# Patient Record
Sex: Male | Born: 1970 | Race: Black or African American | Hispanic: No | Marital: Single | Smoking: Never smoker
Health system: Southern US, Community
[De-identification: ages and names within clinical notes are randomized; demographics above are authoritative.]

## PROBLEM LIST (undated history)

## (undated) DIAGNOSIS — G8220 Paraplegia, unspecified: Secondary | ICD-10-CM

## (undated) DIAGNOSIS — I10 Essential (primary) hypertension: Secondary | ICD-10-CM

## (undated) DIAGNOSIS — Z9359 Other cystostomy status: Secondary | ICD-10-CM

## (undated) DIAGNOSIS — W3400XA Accidental discharge from unspecified firearms or gun, initial encounter: Secondary | ICD-10-CM

## (undated) DIAGNOSIS — Z933 Colostomy status: Secondary | ICD-10-CM

## (undated) DIAGNOSIS — I82409 Acute embolism and thrombosis of unspecified deep veins of unspecified lower extremity: Secondary | ICD-10-CM

## (undated) DIAGNOSIS — N390 Urinary tract infection, site not specified: Secondary | ICD-10-CM

## (undated) HISTORY — PX: INGUINAL HERNIA REPAIR: SUR1180

## (undated) HISTORY — PX: COLON SURGERY: SHX602

---

## 2003-04-24 DIAGNOSIS — W3400XA Accidental discharge from unspecified firearms or gun, initial encounter: Secondary | ICD-10-CM

## 2003-04-24 DIAGNOSIS — G8220 Paraplegia, unspecified: Secondary | ICD-10-CM

## 2003-04-24 HISTORY — DX: Paraplegia, unspecified: G82.20

## 2003-04-24 HISTORY — DX: Accidental discharge from unspecified firearms or gun, initial encounter: W34.00XA

## 2012-10-08 HISTORY — PX: COLOSTOMY: SHX63

## 2016-08-16 ENCOUNTER — Ambulatory Visit: Payer: Self-pay | Admitting: Family Medicine

## 2016-09-23 ENCOUNTER — Observation Stay (HOSPITAL_COMMUNITY)
Admission: EM | Admit: 2016-09-23 | Discharge: 2016-09-23 | Disposition: A | Discharge status: Home / Self Care | Payer: Medicaid Other | Attending: Internal Medicine | Admitting: Internal Medicine

## 2016-09-23 ENCOUNTER — Emergency Department (HOSPITAL_COMMUNITY): Payer: Medicaid Other

## 2016-09-23 ENCOUNTER — Encounter (HOSPITAL_COMMUNITY): Payer: Self-pay | Admitting: Emergency Medicine

## 2016-09-23 DIAGNOSIS — M542 Cervicalgia: Secondary | ICD-10-CM | POA: Diagnosis not present

## 2016-09-23 DIAGNOSIS — I10 Essential (primary) hypertension: Secondary | ICD-10-CM

## 2016-09-23 DIAGNOSIS — N390 Urinary tract infection, site not specified: Principal | ICD-10-CM

## 2016-09-23 DIAGNOSIS — G8220 Paraplegia, unspecified: Secondary | ICD-10-CM | POA: Diagnosis not present

## 2016-09-23 DIAGNOSIS — N179 Acute kidney failure, unspecified: Secondary | ICD-10-CM | POA: Insufficient documentation

## 2016-09-23 DIAGNOSIS — Z933 Colostomy status: Secondary | ICD-10-CM | POA: Diagnosis not present

## 2016-09-23 DIAGNOSIS — Z9359 Other cystostomy status: Secondary | ICD-10-CM

## 2016-09-23 DIAGNOSIS — E876 Hypokalemia: Secondary | ICD-10-CM | POA: Diagnosis not present

## 2016-09-23 DIAGNOSIS — G8929 Other chronic pain: Secondary | ICD-10-CM | POA: Insufficient documentation

## 2016-09-23 DIAGNOSIS — R112 Nausea with vomiting, unspecified: Secondary | ICD-10-CM

## 2016-09-23 HISTORY — DX: Essential (primary) hypertension: I10

## 2016-09-23 HISTORY — DX: Other cystostomy status: Z93.59

## 2016-09-23 HISTORY — DX: Paraplegia, unspecified: G82.20

## 2016-09-23 LAB — URINALYSIS, ROUTINE W REFLEX MICROSCOPIC
BILIRUBIN URINE: NEGATIVE
Glucose, UA: NEGATIVE mg/dL
KETONES UR: NEGATIVE mg/dL
Nitrite: NEGATIVE
PH: 5 (ref 5.0–8.0)
Protein, ur: 30 mg/dL — AB
SPECIFIC GRAVITY, URINE: 1.009 (ref 1.005–1.030)

## 2016-09-23 LAB — I-STAT CG4 LACTIC ACID, ED: Lactic Acid, Venous: 1.83 mmol/L (ref 0.5–1.9)

## 2016-09-23 LAB — CBC WITH DIFFERENTIAL/PLATELET
Basophils Absolute: 0 10*3/uL (ref 0.0–0.1)
Basophils Relative: 0 %
EOS ABS: 0.1 10*3/uL (ref 0.0–0.7)
Eosinophils Relative: 1 %
HCT: 44.9 % (ref 39.0–52.0)
HEMOGLOBIN: 14.6 g/dL (ref 13.0–17.0)
LYMPHS ABS: 2.7 10*3/uL (ref 0.7–4.0)
Lymphocytes Relative: 29 %
MCH: 23.4 pg — AB (ref 26.0–34.0)
MCHC: 32.5 g/dL (ref 30.0–36.0)
MCV: 72.1 fL — AB (ref 78.0–100.0)
MONOS PCT: 8 %
Monocytes Absolute: 0.7 10*3/uL (ref 0.1–1.0)
NEUTROS PCT: 61 %
Neutro Abs: 5.6 10*3/uL (ref 1.7–7.7)
PLATELETS: 263 10*3/uL (ref 150–400)
RBC: 6.23 MIL/uL — AB (ref 4.22–5.81)
RDW: 13.8 % (ref 11.5–15.5)
WBC: 9.1 10*3/uL (ref 4.0–10.5)

## 2016-09-23 LAB — BASIC METABOLIC PANEL
ANION GAP: 11 (ref 5–15)
BUN: 19 mg/dL (ref 6–20)
CHLORIDE: 105 mmol/L (ref 101–111)
CO2: 25 mmol/L (ref 22–32)
CREATININE: 1.22 mg/dL (ref 0.61–1.24)
Calcium: 9.8 mg/dL (ref 8.9–10.3)
GFR calc non Af Amer: >60 mL/min (ref >60)
Glucose, Bld: 134 mg/dL — ABNORMAL HIGH (ref 65–99)
POTASSIUM: 3.4 mmol/L — AB (ref 3.5–5.1)
SODIUM: 141 mmol/L (ref 135–145)

## 2016-09-23 MED ORDER — ENOXAPARIN SODIUM 40 MG/0.4ML ~~LOC~~ SOLN
40.0000 mg | SUBCUTANEOUS | Status: DC
  Filled 2016-09-23: qty 0.4

## 2016-09-23 MED ORDER — CIPROFLOXACIN HCL 500 MG PO TABS: 500.0000 mg | ORAL_TABLET | Freq: Two times a day (BID) | ORAL | 0 refills | Status: DC

## 2016-09-23 MED ORDER — ZOLPIDEM TARTRATE 5 MG PO TABS: 5.0000 mg | ORAL_TABLET | Freq: Every evening | ORAL | Status: DC | PRN

## 2016-09-23 MED ORDER — PIPERACILLIN-TAZOBACTAM 3.375 G IVPB
3.3750 g | Freq: Three times a day (TID) | INTRAVENOUS | Status: DC
  Administered 2016-09-23 (×2): 3.375 g via INTRAVENOUS
  Filled 2016-09-23: qty 50

## 2016-09-23 MED ORDER — ACETAMINOPHEN 325 MG PO TABS
650.0000 mg | ORAL_TABLET | Freq: Once | ORAL | Status: DC
  Filled 2016-09-23: qty 2

## 2016-09-23 MED ORDER — FENTANYL CITRATE (PF) 100 MCG/2ML IJ SOLN
50.0000 ug | Freq: Once | INTRAMUSCULAR | Status: AC
  Administered 2016-09-23: 50 ug via INTRAVENOUS
  Filled 2016-09-23: qty 2

## 2016-09-23 MED ORDER — SODIUM CHLORIDE 0.9 % IV SOLN
INTRAVENOUS | Status: DC
  Administered 2016-09-23: 05:00:00 via INTRAVENOUS

## 2016-09-23 MED ORDER — ACETAMINOPHEN 325 MG PO TABS: 650.0000 mg | ORAL_TABLET | Freq: Four times a day (QID) | ORAL | Status: DC | PRN

## 2016-09-23 MED ORDER — POTASSIUM CHLORIDE 20 MEQ/15ML (10%) PO SOLN
20.0000 meq | Freq: Once | ORAL | Status: AC
  Administered 2016-09-23: 20 meq via ORAL
  Filled 2016-09-23: qty 15

## 2016-09-23 MED ORDER — PIPERACILLIN-TAZOBACTAM 3.375 G IVPB 30 MIN
3.3750 g | Freq: Once | INTRAVENOUS | Status: AC
  Administered 2016-09-23: 3.375 g via INTRAVENOUS
  Filled 2016-09-23: qty 50

## 2016-09-23 MED ORDER — ONDANSETRON HCL 4 MG/2ML IJ SOLN
4.0000 mg | Freq: Once | INTRAMUSCULAR | Status: AC
  Administered 2016-09-23: 4 mg via INTRAVENOUS
  Filled 2016-09-23: qty 2

## 2016-09-23 MED ORDER — ACETAMINOPHEN 650 MG RE SUPP: 650.0000 mg | Freq: Four times a day (QID) | RECTAL | Status: DC | PRN

## 2016-09-23 MED ORDER — HYDRALAZINE HCL 20 MG/ML IJ SOLN: 5.0000 mg | INTRAMUSCULAR | Status: DC | PRN

## 2016-09-23 MED ORDER — ONDANSETRON HCL 4 MG/2ML IJ SOLN: 4.0000 mg | Freq: Three times a day (TID) | INTRAMUSCULAR | Status: DC | PRN
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
## 2016-09-23 NOTE — ED Notes (Signed)
ED Provider at bedside. 

## 2016-09-23 NOTE — ED Notes (Signed)
Bed: WA12 Expected date:  Expected time:  Means of arrival:  Comments: 
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
## 2016-09-23 NOTE — ED Triage Notes (Signed)
Per EMS, pt. From home with complaint recurrent UTI which started last night at 7pm when pt. experienced  Chills and fever at home. Pt. Has chronic foley catheter that had been changed 90 days ago and supposed to have it changed every 30 days. Pt. Is paraplegic for 14 years. Denied SOB. Denied pain.
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
## 2016-09-26 LAB — URINE CULTURE

## 2016-10-02 ENCOUNTER — Telehealth: Payer: Self-pay | Admitting: Internal Medicine

## 2016-10-02 NOTE — Telephone Encounter (Signed)
Patient states that he is feeling just fine and much better after taking his ciprofloxacin even though his urine culture grew ESBL Escherichia coli that was resistant to fluoroquinolone. Told him to seek medical attention if he starts to feel like he is developing a new urinary tract infection for repeat urinalysis and urine culture.

## 2016-10-27 ENCOUNTER — Emergency Department (HOSPITAL_COMMUNITY)
Admission: EM | Admit: 2016-10-27 | Discharge: 2016-10-28 | Disposition: A | Discharge status: Home / Self Care | Payer: Medicaid Other | Attending: Emergency Medicine | Admitting: Emergency Medicine

## 2016-10-27 ENCOUNTER — Encounter (HOSPITAL_COMMUNITY): Payer: Self-pay | Admitting: Nurse Practitioner

## 2016-10-27 DIAGNOSIS — Z960 Presence of urogenital implants: Secondary | ICD-10-CM | POA: Insufficient documentation

## 2016-10-27 DIAGNOSIS — Z5181 Encounter for therapeutic drug level monitoring: Secondary | ICD-10-CM | POA: Diagnosis not present

## 2016-10-27 DIAGNOSIS — I10 Essential (primary) hypertension: Secondary | ICD-10-CM | POA: Insufficient documentation

## 2016-10-27 DIAGNOSIS — R509 Fever, unspecified: Secondary | ICD-10-CM | POA: Diagnosis present

## 2016-10-27 DIAGNOSIS — N390 Urinary tract infection, site not specified: Secondary | ICD-10-CM | POA: Insufficient documentation

## 2016-10-27 DIAGNOSIS — Z9359 Other cystostomy status: Secondary | ICD-10-CM

## 2016-10-27 MED ORDER — GENTAMICIN SULFATE 40 MG/ML IJ SOLN
5.0000 mg/kg | INTRAVENOUS | Status: DC
  Administered 2016-10-28: 520 mg via INTRAVENOUS
  Filled 2016-10-27: qty 13

## 2016-10-27 NOTE — ED Provider Notes (Signed)
[facility]-EMERGENCY DEPT Provider Note   CSN: [id number] Arrival date & time: 10/27/16  2319   By signing my name below, I, Cynda Acres, attest that this documentation has been prepared under the direction and in the presence of Dione Booze, MD. Electronically Signed: Cynda Acres, Scribe. 10/27/16. 11:56 PM.   History   Chief Complaint No chief complaint on file.   HPI Comments: Charles Poole is a 46 y.o. male with a hx of paraplegic spinal paralysis and HTN, who presents to the Emergency Department complaining of a possible UTI, symptoms began last night. Patient states he is having symptoms consistent with a urinary tract infection. Patient states he has had a suprapubic cathter for four years now, in which he has had recurrent UTIs. Patient has associated diaphoresis, chills, vomiting, and a subjective fever. No modifying factors indicated. He denies any nausea.   The history is provided by the patient. No language interpreter was used.    Past Medical History:  Diagnosis Date  . Hypertension   . Paraplegic spinal paralysis (HCC)   . Suprapubic catheter [facility])     Patient Active Problem List   Diagnosis Date Noted  . Complicated UTI (urinary tract infection) 09/23/2016  . Hypokalemia 09/23/2016  . Paraplegic spinal paralysis (HCC)   . Suprapubic catheter (HCC)   . Essential hypertension   . Intractable vomiting with nausea     Past Surgical History:  Procedure Laterality Date  . COLOSTOMY    . SUPRAPUBIC CATHETER INSERTION         Home Medications    Prior to Admission medications   Medication Sig Start Date End Date Taking? Authorizing Provider  ciprofloxacin (CIPRO) 500 MG tablet Take 1 tablet (500 mg total) by mouth 2 (two) times daily. 09/23/16   Renae Fickle, MD    Family History Family History  Problem Relation Age of Onset  . Heart attack Father     Social History Social History  Substance Use Topics  . Smoking status: Never Smoker  .  Smokeless tobacco: Never Used  . Alcohol use 0.6 - 1.2 oz/week    1 - 2 Cans of beer per week     Allergies   Patient has no known allergies.   Review of Systems Review of Systems  Constitutional: Positive for chills, diaphoresis and fever.  Gastrointestinal: Positive for vomiting. Negative for nausea.  All other systems reviewed and are negative.    Physical Exam Updated Vital Signs BP (!) 124/111 (BP Location: Right Wrist)   Pulse 107   Temp 98.3 F (36.8 C) (Oral)   Resp 18   Wt (!) 304 lb 3.8 oz (138 kg)   SpO2 100%   BMI 40.14 kg/m   Physical Exam  Constitutional: He is oriented to person, place, and time. He appears well-developed and well-nourished.  HENT:  Head: Normocephalic and atraumatic.  Eyes: EOM are normal. Pupils are equal, round, and reactive to light.  Neck: Normal range of motion. Neck supple. No JVD present.  Cardiovascular: Normal rate, regular rhythm and normal heart sounds.   No murmur heard. Pulmonary/Chest: Effort normal and breath sounds normal. He has no wheezes. He has no rales. He exhibits no tenderness.  Abdominal: Soft. Bowel sounds are normal. He exhibits no distension and no mass. There is no tenderness.  Suprpubic catheter in place    Musculoskeletal: Normal range of motion. He exhibits no edema.  Lymphadenopathy:    He has no cervical adenopathy.  Neurological: He is alert and oriented  to person, place, and time. No cranial nerve deficit. He exhibits normal muscle tone. Coordination normal.  Paraplegic.   Skin: Skin is warm and dry. No rash noted.  Diaphoretic   Psychiatric: He has a normal mood and affect. His behavior is normal. Judgment and thought content normal.  Nursing note and vitals reviewed.    ED Treatments / Results  DIAGNOSTIC STUDIES: Oxygen Saturation is 100% on RA, normal by my interpretation.    COORDINATION OF CARE: 11:48 PM Discussed treatment plan with pt at bedside and pt agreed to plan.  Labs (all  labs ordered are listed, but only abnormal results are displayed) Labs Reviewed  BASIC METABOLIC PANEL - Abnormal; Notable for the following:       Result Value   Glucose, Bld 135 (*)    BUN 23 (*)    All other components within normal limits  CBC WITH DIFFERENTIAL/PLATELET - Abnormal; Notable for the following:    RBC 6.55 (*)    MCV 72.4 (*)    MCH 23.1 (*)    All other components within normal limits  RAPID URINE DRUG SCREEN, HOSP PERFORMED - Abnormal; Notable for the following:    Tetrahydrocannabinol POSITIVE (*)    All other components within normal limits  URINALYSIS, ROUTINE W REFLEX MICROSCOPIC - Abnormal; Notable for the following:    APPearance TURBID (*)    Hgb urine dipstick MODERATE (*)    Protein, ur 100 (*)    Nitrite POSITIVE (*)    Leukocytes, UA LARGE (*)    Bacteria, UA MANY (*)    Non Squamous Epithelial 0-5 (*)    All other components within normal limits  I-STAT CG4 LACTIC ACID, ED - Abnormal; Notable for the following:    Lactic Acid, Venous 1.91 (*)    All other components within normal limits  URINE CULTURE     Procedures Procedures (including critical care time)  Medications Ordered in ED Medications  gentamicin (GARAMYCIN) 520 mg in dextrose 5 % 100 mL IVPB (0 mg Intravenous Stopped 10/28/16 0127)     Initial Impression / Assessment and Plan / ED Course  I have reviewed the triage vital signs and the nursing notes.  Pertinent lab results that were available during my care of the patient were reviewed by me and considered in my medical decision making (see chart for details).  Probable urinary tract infection in patient with chronic suprapubic catheter. Old records are reviewed, and he had been admitted with urinary tract infection and discharged on ciprofloxacin. However, culture shows that he had Escherichia coli resistant to ciprofloxacin. Only medications it was sensitive to 4 gentamicin, imipenem, and nitrofurantoin. Presumably, he has the  same infecting organism so he is given a dose of gentamicin in the ED. He is nontoxic in appearance and has normal WBC and is afebrile, so I feel that treatment can be initiated as an outpatient. His suprapubic catheter was exchanged, and he is discharged with prescription for nitrofurantoin. Urine culture was obtained. He is referred to urology for follow-up.  Final Clinical Impressions(s) / ED Diagnoses   Final diagnoses:  Urinary tract infection without hematuria, site unspecified  Suprapubic catheter (HCC)    New Prescriptions New Prescriptions   NITROFURANTOIN, MACROCRYSTAL-MONOHYDRATE, (MACROBID) 100 MG CAPSULE    Take 1 capsule (100 mg total) by mouth 2 (two) times daily.   I personally performed the services described in this documentation, which was scribed in my presence. The recorded information has been reviewed and is accurate.  Dione Boozeavid Emre Stock, MD 10/28/16 [phone]

## 2016-10-27 NOTE — ED Triage Notes (Signed)
Pt states he probably has a UTI. He is c/o fever and chills and has obvious diaphoresis. Has a suprapubic catheter and states he has recurrent UTIs and the symptoms he is experiencing are typical of a UTI.

## 2016-10-27 NOTE — ED Notes (Signed)
Bed: WA11 Expected date:  Expected time:  Means of arrival:  Comments: Hold for Hall B 

## 2016-10-28 LAB — BASIC METABOLIC PANEL
Anion gap: 11 (ref 5–15)
BUN: 23 mg/dL — AB (ref 6–20)
CALCIUM: 10 mg/dL (ref 8.9–10.3)
CHLORIDE: 105 mmol/L (ref 101–111)
CO2: 26 mmol/L (ref 22–32)
CREATININE: 1.08 mg/dL (ref 0.61–1.24)
Glucose, Bld: 135 mg/dL — ABNORMAL HIGH (ref 65–99)
Potassium: 3.8 mmol/L (ref 3.5–5.1)
SODIUM: 142 mmol/L (ref 135–145)

## 2016-10-28 LAB — I-STAT CG4 LACTIC ACID, ED: LACTIC ACID, VENOUS: 1.91 mmol/L — AB (ref 0.5–1.9)

## 2016-10-28 LAB — URINALYSIS, ROUTINE W REFLEX MICROSCOPIC
Bilirubin Urine: NEGATIVE
GLUCOSE, UA: NEGATIVE mg/dL
Ketones, ur: NEGATIVE mg/dL
Nitrite: POSITIVE — AB
PH: 6 (ref 5.0–8.0)
Protein, ur: 100 mg/dL — AB
Specific Gravity, Urine: 1.011 (ref 1.005–1.030)
Squamous Epithelial / LPF: NONE SEEN

## 2016-10-28 LAB — CBC WITH DIFFERENTIAL/PLATELET
BASOS PCT: 0 %
Basophils Absolute: 0 10*3/uL (ref 0.0–0.1)
EOS ABS: 0.1 10*3/uL (ref 0.0–0.7)
EOS PCT: 1 %
HCT: 47.4 % (ref 39.0–52.0)
Hemoglobin: 15.1 g/dL (ref 13.0–17.0)
LYMPHS ABS: 2.1 10*3/uL (ref 0.7–4.0)
Lymphocytes Relative: 24 %
MCH: 23.1 pg — ABNORMAL LOW (ref 26.0–34.0)
MCHC: 31.9 g/dL (ref 30.0–36.0)
MCV: 72.4 fL — ABNORMAL LOW (ref 78.0–100.0)
Monocytes Absolute: 0.6 10*3/uL (ref 0.1–1.0)
Monocytes Relative: 7 %
NEUTROS PCT: 68 %
Neutro Abs: 5.9 10*3/uL (ref 1.7–7.7)
PLATELETS: 204 10*3/uL (ref 150–400)
RBC: 6.55 MIL/uL — AB (ref 4.22–5.81)
RDW: 14.5 % (ref 11.5–15.5)
WBC: 8.7 10*3/uL (ref 4.0–10.5)

## 2016-10-28 LAB — RAPID URINE DRUG SCREEN, HOSP PERFORMED
Amphetamines: NOT DETECTED
BENZODIAZEPINES: NOT DETECTED
Barbiturates: NOT DETECTED
Cocaine: NOT DETECTED
OPIATES: NOT DETECTED
Tetrahydrocannabinol: POSITIVE — AB

## 2016-10-28 MED ORDER — NITROFURANTOIN MONOHYD MACRO 100 MG PO CAPS: 100.0000 mg | ORAL_CAPSULE | Freq: Two times a day (BID) | ORAL | 0 refills | Status: DC

## 2016-10-28 NOTE — Discharge Instructions (Signed)
He will always be prone to urinary infections because of the suprapubic catheter. Please contact the urologist to get established in their practice. Urine has been sent for culture. If culture shows she need to be on a different antibiotic, we will contact you. Return to the emergency department if you start running a fever or start feeling worse.

## 2016-10-28 NOTE — ED Notes (Signed)
Patient denies pain and is resting comfortably.  

## 2016-10-28 NOTE — ED Notes (Signed)
D/C information reviewed with patient. Patient is upset that he is being discharged and not admitted for his UTI. Explained to him that per results he was given an initial dose of antibiotics and is also being sent home with a prescription. He states everytime this happens that the doctor always calls and says his culture is growing something different and has to change medicines and that is why he should be admitted for 3-4 days until culture comes back. Explained that it is a normal process to go home on one medication and due to him having a suprapubic and other medical conditions that makes him highly susceptible to UTIs that his culture may show some resistance and assured him he would be readily contacted and a new prescription would be given however, the condition did not warrant a hospital stay pending those results. Pt verbalized understanding and is patiently waiting for [facility] transportation.

## 2016-10-31 LAB — URINE CULTURE

## 2016-11-01 ENCOUNTER — Telehealth (HOSPITAL_BASED_OUTPATIENT_CLINIC_OR_DEPARTMENT_OTHER): Payer: Self-pay

## 2016-11-01 NOTE — Progress Notes (Signed)
ED Antimicrobial Stewardship Positive Culture Follow Up   Charles GarfinkelKenneth Poole is an 46 y.o. male who [facility] with a chief complaint of No chief complaint on file.   Recent Results (from the past 720 hour(s))  Urine culture     Status: Abnormal   Collection Time: 10/28/16  1:44 AM  Result Value Ref Range Status   Specimen Description URINE, CLEAN CATCH  Final   Special Requests NONE  Final   Culture (A)  Final    >=100,000 COLONIES/mL SERRATIA MARCESCENS >=100,000 COLONIES/mL ESCHERICHIA COLI Confirmed Extended Spectrum Beta-Lactamase Producer (ESBL) Performed at [facility], [address] [place], [place] [address]    Report Status 10/31/2016 FINAL  Final   Organism ID, Bacteria SERRATIA MARCESCENS (A)  Final   Organism ID, Bacteria ESCHERICHIA COLI (A)  Final      Susceptibility   Escherichia coli - MIC*    AMPICILLIN >=32 RESISTANT Resistant     CEFAZOLIN >=64 RESISTANT Resistant     CEFTRIAXONE >=64 RESISTANT Resistant     CIPROFLOXACIN >=4 RESISTANT Resistant     GENTAMICIN <=1 SENSITIVE Sensitive     IMIPENEM <=0.25 SENSITIVE Sensitive     NITROFURANTOIN <=16 SENSITIVE Sensitive     TRIMETH/SULFA >=320 RESISTANT Resistant     AMPICILLIN/SULBACTAM 16 INTERMEDIATE Intermediate     PIP/TAZO <=4 SENSITIVE Sensitive     Extended ESBL POSITIVE Resistant     * >=100,000 COLONIES/mL ESCHERICHIA COLI   Serratia marcescens - MIC*    CEFAZOLIN >=64 RESISTANT Resistant     CEFTRIAXONE >=64 RESISTANT Resistant     CIPROFLOXACIN 2 INTERMEDIATE Intermediate     GENTAMICIN <=1 SENSITIVE Sensitive     NITROFURANTOIN 128 RESISTANT Resistant     TRIMETH/SULFA 40 SENSITIVE Sensitive     * >=100,000 COLONIES/mL SERRATIA MARCESCENS    [x]  Treated with Nitrofurantoin, organism resistant to prescribed antimicrobial []  Patient discharged originally without antimicrobial agent and treatment is now indicated  New antibiotic prescription:  CONTINUE:  nitrofurantoin START: Bactrim DS 1 tab PO BID x 5 days  ED Provider: Fayrene HelperBowie Tran, PA-C   Mahala MenghiniMargaret E Shuda 11/01/2016, 9:18 AM Infectious Diseases Pharmacist Phone# [phone]

## 2016-11-01 NOTE — Telephone Encounter (Signed)
Post ED Visit - Positive Culture Follow-up: Successful Patient Follow-Up  Culture assessed and recommendations reviewed by: []  Enzo BiNathan Batchelder, Pharm.D. []  Celedonio MiyamotoJeremy Frens, [address] BCPS []  Garvin FilaMike Maccia, Pharm.D. []  Georgina PillionElizabeth Martin, Pharm.D., BCPS []  Mahanoy CityMinh Pham, [address]D., BCPS, AAHIVP []  Estella HuskMichelle Turner, Pharm.D., BCPS, AAHIVP []  Tennis Mustassie Stewart, Pharm.D. []  Sherle Poeob Vincent, [address]Ophelia Shoulder. X  Margaret Shuda, Pharm.D.  Positive urine culture  []  Patient discharged without antimicrobial prescription and treatment is now indicated [x]  Organism is resistant to prescribed ED discharge antimicrobial sent out on Nitrofurantoin, needs addl abx []  Patient with positive blood cultures  Changes discussed with ED provider: Fayrene HelperBowie Tran PA New antibiotic prescription "Continue Nitrofurantoin Start Bactrim DS  1 tab po BID x 5 days" [phone] and left on VM.    Contacted patient, date 11/01/16, time 1533  Pt informed.   Arvid RightClark, Giacomo Valone Dorn 11/01/2016, 4:03 PM

## 2016-11-20 ENCOUNTER — Inpatient Hospital Stay (HOSPITAL_COMMUNITY)
Admission: EM | Admit: 2016-11-20 | Discharge: 2016-11-22 | DRG: 690 | Disposition: A | Discharge status: Home / Self Care | Payer: Medicaid Other | Attending: Internal Medicine | Admitting: Internal Medicine

## 2016-11-20 ENCOUNTER — Encounter (HOSPITAL_COMMUNITY): Payer: Self-pay | Admitting: Emergency Medicine

## 2016-11-20 DIAGNOSIS — W3400XS Accidental discharge from unspecified firearms or gun, sequela: Secondary | ICD-10-CM

## 2016-11-20 DIAGNOSIS — R509 Fever, unspecified: Secondary | ICD-10-CM

## 2016-11-20 DIAGNOSIS — D62 Acute posthemorrhagic anemia: Secondary | ICD-10-CM | POA: Diagnosis present

## 2016-11-20 DIAGNOSIS — A419 Sepsis, unspecified organism: Secondary | ICD-10-CM | POA: Diagnosis present

## 2016-11-20 DIAGNOSIS — K920 Hematemesis: Secondary | ICD-10-CM | POA: Diagnosis present

## 2016-11-20 DIAGNOSIS — Z8249 Family history of ischemic heart disease and other diseases of the circulatory system: Secondary | ICD-10-CM

## 2016-11-20 DIAGNOSIS — Z87440 Personal history of urinary (tract) infections: Secondary | ICD-10-CM

## 2016-11-20 DIAGNOSIS — N390 Urinary tract infection, site not specified: Secondary | ICD-10-CM | POA: Diagnosis present

## 2016-11-20 DIAGNOSIS — Z933 Colostomy status: Secondary | ICD-10-CM

## 2016-11-20 DIAGNOSIS — G8220 Paraplegia, unspecified: Secondary | ICD-10-CM | POA: Diagnosis present

## 2016-11-20 DIAGNOSIS — I10 Essential (primary) hypertension: Secondary | ICD-10-CM | POA: Diagnosis present

## 2016-11-20 HISTORY — DX: Colostomy status: Z93.3

## 2016-11-20 HISTORY — DX: Urinary tract infection, site not specified: N39.0

## 2016-11-20 HISTORY — DX: Accidental discharge from unspecified firearms or gun, initial encounter: W34.00XA

## 2016-11-20 LAB — COMPREHENSIVE METABOLIC PANEL
ALBUMIN: 3.8 g/dL (ref 3.5–5.0)
ALK PHOS: 65 U/L (ref 38–126)
ALT: 26 U/L (ref 17–63)
AST: 26 U/L (ref 15–41)
Anion gap: 10 (ref 5–15)
BUN: 25 mg/dL — ABNORMAL HIGH (ref 6–20)
CALCIUM: 9.7 mg/dL (ref 8.9–10.3)
CHLORIDE: 105 mmol/L (ref 101–111)
CO2: 24 mmol/L (ref 22–32)
CREATININE: 1.69 mg/dL — AB (ref 0.61–1.24)
GFR calc Af Amer: 55 mL/min — ABNORMAL LOW (ref >60)
GFR calc non Af Amer: 48 mL/min — ABNORMAL LOW (ref >60)
Glucose, Bld: 137 mg/dL — ABNORMAL HIGH (ref 65–99)
Potassium: 4.6 mmol/L (ref 3.5–5.1)
SODIUM: 139 mmol/L (ref 135–145)
Total Bilirubin: 0.5 mg/dL (ref 0.3–1.2)
Total Protein: 7 g/dL (ref 6.5–8.1)

## 2016-11-20 LAB — I-STAT CG4 LACTIC ACID, ED: Lactic Acid, Venous: 2.51 mmol/L (ref 0.5–1.9)

## 2016-11-20 MED ORDER — SODIUM CHLORIDE 0.9 % IV BOLUS (SEPSIS): 1000.0000 mL | Freq: Once | INTRAVENOUS | Status: DC

## 2016-11-20 MED ORDER — STERILE WATER FOR INJECTION IJ SOLN
INTRAMUSCULAR | Status: AC
  Filled 2016-11-20: qty 10

## 2016-11-20 MED ORDER — SODIUM CHLORIDE 0.9 % IV SOLN
1.0000 g | Freq: Once | INTRAVENOUS | Status: AC
  Administered 2016-11-21: 1 g via INTRAVENOUS
  Filled 2016-11-20: qty 1

## 2016-11-20 MED ORDER — MEROPENEM 1 G IV SOLR
1.0000 g | Freq: Three times a day (TID) | INTRAVENOUS | Status: DC
  Filled 2016-11-20 (×2): qty 1

## 2016-11-20 MED ORDER — PANTOPRAZOLE SODIUM 40 MG IV SOLR
40.0000 mg | Freq: Once | INTRAVENOUS | Status: AC
  Administered 2016-11-21: 40 mg via INTRAVENOUS
  Filled 2016-11-20: qty 40

## 2016-11-20 MED ORDER — SODIUM CHLORIDE 0.9 % IV BOLUS (SEPSIS)
1000.0000 mL | Freq: Once | INTRAVENOUS | Status: AC
  Administered 2016-11-20: 1000 mL via INTRAVENOUS

## 2016-11-20 MED ORDER — SODIUM CHLORIDE 0.9 % IV BOLUS (SEPSIS): 500.0000 mL | Freq: Once | INTRAVENOUS | Status: DC
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
## 2016-11-20 NOTE — ED Provider Notes (Signed)
Patient with shaking chills and diaphoresis for the past 3 days. He vomited 3 times today. He has had similar symptoms in the past when he's had a urinary tract infection. On exam patient is diaphoretic, tremulous. Alert and appropriate Glasgow Coma Score 15 lungs clear auscultation heart regular rate and rhythm abdomen nondistended nontender extremities without edema skin without rash no decubitus ulcer. Genitalia normal male. He has suprapubic catheter in place. Patient markedly more anemic over 3 weeks ago has also had increase in creatinine. Code sepsis called   Doug SouSam Kiran Lapine, MD 11/20/16 [phone]

## 2016-11-21 ENCOUNTER — Encounter (HOSPITAL_COMMUNITY): Payer: Self-pay | Admitting: Internal Medicine

## 2016-11-21 ENCOUNTER — Emergency Department (HOSPITAL_COMMUNITY): Payer: Medicaid Other

## 2016-11-21 DIAGNOSIS — A419 Sepsis, unspecified organism: Secondary | ICD-10-CM | POA: Diagnosis not present

## 2016-11-21 DIAGNOSIS — Z8249 Family history of ischemic heart disease and other diseases of the circulatory system: Secondary | ICD-10-CM | POA: Diagnosis not present

## 2016-11-21 DIAGNOSIS — K920 Hematemesis: Secondary | ICD-10-CM | POA: Diagnosis present

## 2016-11-21 DIAGNOSIS — G8220 Paraplegia, unspecified: Secondary | ICD-10-CM | POA: Diagnosis present

## 2016-11-21 DIAGNOSIS — D62 Acute posthemorrhagic anemia: Secondary | ICD-10-CM | POA: Diagnosis not present

## 2016-11-21 DIAGNOSIS — N390 Urinary tract infection, site not specified: Secondary | ICD-10-CM | POA: Diagnosis present

## 2016-11-21 DIAGNOSIS — Z87440 Personal history of urinary (tract) infections: Secondary | ICD-10-CM | POA: Diagnosis not present

## 2016-11-21 DIAGNOSIS — Z933 Colostomy status: Secondary | ICD-10-CM | POA: Diagnosis not present

## 2016-11-21 DIAGNOSIS — R509 Fever, unspecified: Secondary | ICD-10-CM | POA: Diagnosis present

## 2016-11-21 DIAGNOSIS — W3400XS Accidental discharge from unspecified firearms or gun, sequela: Secondary | ICD-10-CM | POA: Diagnosis not present

## 2016-11-21 DIAGNOSIS — I10 Essential (primary) hypertension: Secondary | ICD-10-CM | POA: Diagnosis present

## 2016-11-21 LAB — COMPREHENSIVE METABOLIC PANEL
ALBUMIN: 3.3 g/dL — AB (ref 3.5–5.0)
ALT: 21 U/L (ref 17–63)
ANION GAP: 8 (ref 5–15)
AST: 19 U/L (ref 15–41)
Alkaline Phosphatase: 58 U/L (ref 38–126)
BILIRUBIN TOTAL: 0.3 mg/dL (ref 0.3–1.2)
BUN: 31 mg/dL — AB (ref 6–20)
CHLORIDE: 108 mmol/L (ref 101–111)
CO2: 24 mmol/L (ref 22–32)
Calcium: 9.2 mg/dL (ref 8.9–10.3)
Creatinine, Ser: 1.83 mg/dL — ABNORMAL HIGH (ref 0.61–1.24)
GFR calc Af Amer: 50 mL/min — ABNORMAL LOW (ref >60)
GFR calc non Af Amer: 43 mL/min — ABNORMAL LOW (ref >60)
GLUCOSE: 134 mg/dL — AB (ref 65–99)
POTASSIUM: 4.2 mmol/L (ref 3.5–5.1)
SODIUM: 140 mmol/L (ref 135–145)
Total Protein: 6.4 g/dL — ABNORMAL LOW (ref 6.5–8.1)

## 2016-11-21 LAB — CBC
HCT: 39.7 % (ref 39.0–52.0)
HEMATOCRIT: 41.8 % (ref 39.0–52.0)
HEMATOCRIT: 40.1 % (ref 39.0–52.0)
Hemoglobin: 13.3 g/dL (ref 13.0–17.0)
Hemoglobin: 12.2 g/dL — ABNORMAL LOW (ref 13.0–17.0)
Hemoglobin: 12.4 g/dL — ABNORMAL LOW (ref 13.0–17.0)
MCH: 23.1 pg — AB (ref 26.0–34.0)
MCH: 22.6 pg — AB (ref 26.0–34.0)
MCH: 22.7 pg — ABNORMAL LOW (ref 26.0–34.0)
MCHC: 31.8 g/dL (ref 30.0–36.0)
MCHC: 30.7 g/dL (ref 30.0–36.0)
MCHC: 30.9 g/dL (ref 30.0–36.0)
MCV: 72.7 fL — ABNORMAL LOW (ref 78.0–100.0)
MCV: 73.4 fL — ABNORMAL LOW (ref 78.0–100.0)
MCV: 73.3 fL — ABNORMAL LOW (ref 78.0–100.0)
PLATELETS: 149 10*3/uL — AB (ref 150–400)
PLATELETS: 148 10*3/uL — AB (ref 150–400)
Platelets: 170 10*3/uL (ref 150–400)
RBC: 5.75 MIL/uL (ref 4.22–5.81)
RBC: 5.41 MIL/uL (ref 4.22–5.81)
RBC: 5.47 MIL/uL (ref 4.22–5.81)
RDW: 15.5 % (ref 11.5–15.5)
RDW: 15.5 % (ref 11.5–15.5)
RDW: 15.7 % — AB (ref 11.5–15.5)
WBC: 6.5 10*3/uL (ref 4.0–10.5)
WBC: 6.6 10*3/uL (ref 4.0–10.5)
WBC: 6.2 10*3/uL (ref 4.0–10.5)

## 2016-11-21 LAB — CBC WITH DIFFERENTIAL/PLATELET
BASOS PCT: 0 %
Basophils Absolute: 0 10*3/uL (ref 0.0–0.1)
EOS ABS: 0.2 10*3/uL (ref 0.0–0.7)
EOS PCT: 3 %
HCT: 41.3 % (ref 39.0–52.0)
Hemoglobin: 9.4 g/dL — ABNORMAL LOW (ref 13.0–17.0)
Lymphocytes Relative: 33 %
Lymphs Abs: 2 10*3/uL (ref 0.7–4.0)
MCH: 16.5 pg — AB (ref 26.0–34.0)
MCHC: 22.8 g/dL — ABNORMAL LOW (ref 30.0–36.0)
MCV: 72.6 fL — AB (ref 78.0–100.0)
MONO ABS: 0.4 10*3/uL (ref 0.1–1.0)
Monocytes Relative: 7 %
NEUTROS ABS: 3.5 10*3/uL (ref 1.7–7.7)
NEUTROS PCT: 57 %
PLATELETS: 135 10*3/uL — AB (ref 150–400)
RBC: 5.69 MIL/uL (ref 4.22–5.81)
RDW: 15.1 % (ref 11.5–15.5)
WBC: 6.1 10*3/uL (ref 4.0–10.5)

## 2016-11-21 LAB — TYPE AND SCREEN
ABO/RH(D): AB POS
Antibody Screen: NEGATIVE

## 2016-11-21 LAB — LACTIC ACID, PLASMA
Lactic Acid, Venous: 1.8 mmol/L (ref 0.5–1.9)
Lactic Acid, Venous: 2.1 mmol/L (ref 0.5–1.9)

## 2016-11-21 LAB — ABO/RH: ABO/RH(D): AB POS

## 2016-11-21 LAB — PROCALCITONIN: PROCALCITONIN: 0.1 ng/mL

## 2016-11-21 LAB — URINALYSIS, ROUTINE W REFLEX MICROSCOPIC
BILIRUBIN URINE: NEGATIVE
Glucose, UA: 50 mg/dL — AB
KETONES UR: NEGATIVE mg/dL
NITRITE: NEGATIVE
PROTEIN: 30 mg/dL — AB
SPECIFIC GRAVITY, URINE: 1.011 (ref 1.005–1.030)
pH: 7 (ref 5.0–8.0)

## 2016-11-21 LAB — GLUCOSE, CAPILLARY: Glucose-Capillary: 126 mg/dL — ABNORMAL HIGH (ref 65–99)

## 2016-11-21 LAB — PROTIME-INR
INR: 1.01
PROTHROMBIN TIME: 13.3 s (ref 11.4–15.2)

## 2016-11-21 LAB — APTT: APTT: 30 s (ref 24–36)

## 2016-11-21 LAB — I-STAT CG4 LACTIC ACID, ED: LACTIC ACID, VENOUS: 1.93 mmol/L — AB (ref 0.5–1.9)

## 2016-11-21 MED ORDER — SODIUM CHLORIDE 0.9 % IV SOLN
80.0000 mg | Freq: Once | INTRAVENOUS | Status: AC
  Administered 2016-11-21: 80 mg via INTRAVENOUS
  Filled 2016-11-21: qty 80

## 2016-11-21 MED ORDER — PANTOPRAZOLE SODIUM 40 MG IV SOLR: 40.0000 mg | Freq: Two times a day (BID) | INTRAVENOUS | Status: DC

## 2016-11-21 MED ORDER — ACETAMINOPHEN 650 MG RE SUPP: 650.0000 mg | Freq: Four times a day (QID) | RECTAL | Status: DC | PRN

## 2016-11-21 MED ORDER — SODIUM CHLORIDE 0.9 % IV SOLN
8.0000 mg/h | INTRAVENOUS | Status: DC
  Administered 2016-11-21: 8 mg/h via INTRAVENOUS
  Filled 2016-11-21 (×2): qty 80

## 2016-11-21 MED ORDER — ONDANSETRON HCL 4 MG/2ML IJ SOLN: 4.0000 mg | Freq: Four times a day (QID) | INTRAMUSCULAR | Status: DC | PRN

## 2016-11-21 MED ORDER — SODIUM CHLORIDE 0.9 % IV BOLUS (SEPSIS)
1000.0000 mL | Freq: Once | INTRAVENOUS | Status: AC
  Administered 2016-11-21: 1000 mL via INTRAVENOUS

## 2016-11-21 MED ORDER — SODIUM CHLORIDE 0.9 % IV SOLN
1.0000 g | Freq: Three times a day (TID) | INTRAVENOUS | Status: DC
  Administered 2016-11-21 – 2016-11-22 (×5): 1 g via INTRAVENOUS
  Filled 2016-11-21 (×6): qty 1

## 2016-11-21 MED ORDER — SODIUM CHLORIDE 0.9 % IV SOLN
INTRAVENOUS | Status: AC
  Administered 2016-11-21 (×2): via INTRAVENOUS

## 2016-11-21 MED ORDER — ACETAMINOPHEN 325 MG PO TABS: 650.0000 mg | ORAL_TABLET | Freq: Four times a day (QID) | ORAL | Status: DC | PRN

## 2016-11-21 MED ORDER — PANTOPRAZOLE SODIUM 40 MG PO TBEC
40.0000 mg | DELAYED_RELEASE_TABLET | Freq: Two times a day (BID) | ORAL | Status: DC
  Administered 2016-11-21 – 2016-11-22 (×3): 40 mg via ORAL
  Filled 2016-11-21 (×3): qty 1

## 2016-11-21 MED ORDER — ONDANSETRON HCL 4 MG PO TABS: 4.0000 mg | ORAL_TABLET | Freq: Four times a day (QID) | ORAL | Status: DC | PRN

## 2016-11-21 NOTE — Progress Notes (Signed)
Pharmacy Antibiotic Note  Charles GarfinkelKenneth Poole is a 46 y.o. male admitted on 11/20/2016 with UTI.  Pharmacy has been consulted for Merrem dosing. Hx of serratia and ESBL E Coli. WBC WNL. Acute renal failure.   Plan: -Merrem 1g IV q8h -Trend WBC, temp, renal function -F/U urine culture for directed therapy   Temp (24hrs), Avg:98 F (36.7 C), Min:97.7 F (36.5 C), Max:98.3 F (36.8 C)    Recent Labs Lab 11/20/16 2306 11/20/16 2311 11/21/16 0223  WBC 6.1  --   --   CREATININE 1.69*  --   --   LATICACIDVEN  --  2.51* 1.93*    CrCl cannot be calculated (Unknown ideal weight.).    No Known Allergies  Charles Poole, Charles Poole 11/21/2016 3:00 AM

## 2016-11-21 NOTE — Progress Notes (Signed)
Critical lactic acid 2.1 from lab this morning. MD paged with results.

## 2016-11-21 NOTE — Care Management Note (Signed)
Case Management Note  Patient Details  Name: Marshell GarfinkelKenneth Esquibel MRN: [id number] Date of Birth: 13-Jan-1971  Subjective/Objective:    Patient presents with sepsis, which was ruled out, has uti, conts on abx, gib, but with no evidence of active bleeding with hgb of 9.6 which was an error, conts on oral protonix, ivf's, and iv abx. Transferring to floor,  NCM will cont to follow for dc needs.               Action/Plan:   Expected Discharge Date:                  Expected Discharge Plan:  Home/Self Care  In-House Referral:     Discharge planning Services  CM Consult  Post Acute Care Choice:    Choice offered to:     DME Arranged:    DME Agency:     HH Arranged:    HH Agency:     Status of Service:  In process, will continue to follow  If discussed at Long Length of Stay Meetings, dates discussed:    Additional Comments:  Leone Havenaylor, [place] Shaul Clinton, RN 11/21/2016, 4:04 PM

## 2016-11-21 NOTE — Progress Notes (Signed)
Admission note:  Arrival Method: transfer from 4e, pt arrived in bed accompanied by nurse Mental Orientation: A&ox4 Telemetry: continued, CCMD notified. Assessment: completed Skin: intact. IV: left AC with NS infusing at 12925ml/hr; site clean, dry and intact Pain: denies any pain Tubes: , colostomy and suprapubic cath noted. Safety Measures and fall prevention safety plan reviewed with pt, verbalized understanding. Call bell and telephone within reach.  6700 Orientation: Patient has been oriented to the unit, staff and to the room.

## 2016-11-21 NOTE — Progress Notes (Signed)
[facility] PLAN OF CARE NOTE Patient: Charles GarfinkelKenneth Poole UJW:[id number]   PCP: Azzie RoupANDERSON, SHANE D, FNP DOB: 12-Aug-1971   DOA: 11/20/2016   DOS: 11/21/2016    Patient was admitted by my colleague Dr. Toniann FailKakrakandy  earlier on 11/21/2016. I have reviewed the H&P as well as assessment and plan and agree with the same. Important changes in the plan are listed below.  Plan of care: Principal Problem:   Sepsis (HCC) Ruled out.  Active Problems:   Complicated UTI (urinary tract infection) Continue current Antibiotics  In isolation     Acute blood loss anemia, suspected GI bleed. No evidence of active bleeding, appears that the low hb of 9.6 was an error. Continue oral protonix, stop IV infusion, change to telemetry   Author: Lynden OxfordPranav Patel, MD [facility] Pager: [phone] 11/21/2016 1:57 PM   If 7PM-7AM, please contact night-coverage at [email or web address], password [email or web address]
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
## 2016-11-21 NOTE — Progress Notes (Signed)
Report called to Saddie Benders6E, Carla RN. Patient will be going to room 22. Expecting lunch tray at any time so will wait to transfer for just a short while. Charge nurse aware.

## 2016-11-22 ENCOUNTER — Encounter (HOSPITAL_COMMUNITY): Payer: Self-pay | Admitting: General Practice

## 2016-11-22 LAB — COMPREHENSIVE METABOLIC PANEL
ALBUMIN: 3.2 g/dL — AB (ref 3.5–5.0)
ALT: 18 U/L (ref 17–63)
ANION GAP: 7 (ref 5–15)
AST: 18 U/L (ref 15–41)
Alkaline Phosphatase: 53 U/L (ref 38–126)
BUN: 14 mg/dL (ref 6–20)
CHLORIDE: 109 mmol/L (ref 101–111)
CO2: 23 mmol/L (ref 22–32)
Calcium: 8.9 mg/dL (ref 8.9–10.3)
Creatinine, Ser: 0.93 mg/dL (ref 0.61–1.24)
GFR calc Af Amer: >60 mL/min (ref >60)
Glucose, Bld: 125 mg/dL — ABNORMAL HIGH (ref 65–99)
POTASSIUM: 4.1 mmol/L (ref 3.5–5.1)
Sodium: 139 mmol/L (ref 135–145)
Total Bilirubin: 0.7 mg/dL (ref 0.3–1.2)
Total Protein: 6 g/dL — ABNORMAL LOW (ref 6.5–8.1)

## 2016-11-22 LAB — CBC WITH DIFFERENTIAL/PLATELET
BASOS ABS: 0 10*3/uL (ref 0.0–0.1)
Basophils Relative: 0 %
EOS PCT: 3 %
Eosinophils Absolute: 0.1 10*3/uL (ref 0.0–0.7)
HCT: 37.1 % — ABNORMAL LOW (ref 39.0–52.0)
Hemoglobin: 11.8 g/dL — ABNORMAL LOW (ref 13.0–17.0)
Lymphocytes Relative: 45 %
Lymphs Abs: 2.1 10*3/uL (ref 0.7–4.0)
MCH: 23 pg — ABNORMAL LOW (ref 26.0–34.0)
MCHC: 31.8 g/dL (ref 30.0–36.0)
MCV: 72.5 fL — AB (ref 78.0–100.0)
MONO ABS: 0.3 10*3/uL (ref 0.1–1.0)
Monocytes Relative: 6 %
Neutro Abs: 2.1 10*3/uL (ref 1.7–7.7)
Neutrophils Relative %: 46 %
PLATELETS: 121 10*3/uL — AB (ref 150–400)
RBC: 5.12 MIL/uL (ref 4.22–5.81)
RDW: 15.7 % — AB (ref 11.5–15.5)
WBC: 4.7 10*3/uL (ref 4.0–10.5)

## 2016-11-22 LAB — URINE CULTURE: Culture: <10000 — AB

## 2016-11-22 LAB — MAGNESIUM: MAGNESIUM: 1.6 mg/dL — AB (ref 1.7–2.4)

## 2016-11-22 MED ORDER — NITROFURANTOIN MONOHYD MACRO 100 MG PO CAPS: 100.0000 mg | ORAL_CAPSULE | Freq: Two times a day (BID) | ORAL | 0 refills | Status: AC

## 2016-11-22 MED ORDER — NITROFURANTOIN MONOHYD MACRO 100 MG PO CAPS
100.0000 mg | ORAL_CAPSULE | Freq: Two times a day (BID) | ORAL | Status: DC
  Filled 2016-11-22 (×2): qty 1

## 2016-11-22 MED ORDER — NITROFURANTOIN MONOHYD MACRO 100 MG PO CAPS
100.0000 mg | ORAL_CAPSULE | Freq: Two times a day (BID) | ORAL | Status: DC
Start: 2016-11-22 — End: 2016-11-22

## 2016-11-22 MED ORDER — FERROUS SULFATE 325 (65 FE) MG PO TABS: 325.0000 mg | ORAL_TABLET | Freq: Three times a day (TID) | ORAL | 0 refills | Status: DC

## 2016-11-22 NOTE — Progress Notes (Signed)
Patient refusing SCD's.  MD notified.

## 2016-11-22 NOTE — Progress Notes (Signed)
Discharge instructions and medications discussed with patient.  Prescription given to patient.  All questions answered.  

## 2016-11-25 LAB — CULTURE, BLOOD (ROUTINE X 2)
CULTURE: NO GROWTH
Culture: NO GROWTH
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
## 2017-01-09 ENCOUNTER — Telehealth: Payer: Self-pay | Admitting: Gastroenterology

## 2017-01-09 NOTE — Telephone Encounter (Signed)
Per Dr. Adela Lank, this is a general surgery issue. I left Revonda Standard at [facility] a voice message informing her of this.

## 2017-02-19 ENCOUNTER — Encounter (HOSPITAL_COMMUNITY): Payer: Self-pay | Admitting: Family Medicine

## 2017-02-19 ENCOUNTER — Ambulatory Visit (INDEPENDENT_AMBULATORY_CARE_PROVIDER_SITE_OTHER): Payer: Medicaid Other

## 2017-02-19 ENCOUNTER — Ambulatory Visit (HOSPITAL_COMMUNITY)
Admission: EM | Admit: 2017-02-19 | Discharge: 2017-02-19 | Disposition: A | Discharge status: Home / Self Care | Payer: Medicaid Other | Attending: Internal Medicine | Admitting: Internal Medicine

## 2017-02-19 DIAGNOSIS — M546 Pain in thoracic spine: Secondary | ICD-10-CM

## 2017-02-19 DIAGNOSIS — G8929 Other chronic pain: Secondary | ICD-10-CM
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
## 2017-02-19 NOTE — Discharge Instructions (Signed)
I provided copies of your x-ray reports in your discharge paperwork. As your doctors are in the [facility] system, they also have access to the original's in "my care everywhere" available within the System. I recommend following up with your primary care provider as needed further evaluation and management of your condition.

## 2017-05-10 ENCOUNTER — Encounter (HOSPITAL_COMMUNITY): Payer: Self-pay | Admitting: Emergency Medicine

## 2017-05-10 ENCOUNTER — Inpatient Hospital Stay (HOSPITAL_COMMUNITY)
Admission: EM | Admit: 2017-05-10 | Discharge: 2017-05-14 | DRG: 698 | Disposition: A | Discharge status: Home / Self Care | Payer: Medicaid Other | Attending: Internal Medicine | Admitting: Internal Medicine

## 2017-05-10 DIAGNOSIS — Z87440 Personal history of urinary (tract) infections: Secondary | ICD-10-CM

## 2017-05-10 DIAGNOSIS — Y92009 Unspecified place in unspecified non-institutional (private) residence as the place of occurrence of the external cause: Secondary | ICD-10-CM

## 2017-05-10 DIAGNOSIS — G8221 Paraplegia, complete: Secondary | ICD-10-CM

## 2017-05-10 DIAGNOSIS — T83511A Infection and inflammatory reaction due to indwelling urethral catheter, initial encounter: Principal | ICD-10-CM | POA: Diagnosis present

## 2017-05-10 DIAGNOSIS — R112 Nausea with vomiting, unspecified: Secondary | ICD-10-CM | POA: Diagnosis present

## 2017-05-10 DIAGNOSIS — G8220 Paraplegia, unspecified: Secondary | ICD-10-CM | POA: Diagnosis present

## 2017-05-10 DIAGNOSIS — Z8619 Personal history of other infectious and parasitic diseases: Secondary | ICD-10-CM

## 2017-05-10 DIAGNOSIS — B961 Klebsiella pneumoniae [K. pneumoniae] as the cause of diseases classified elsewhere: Secondary | ICD-10-CM | POA: Diagnosis present

## 2017-05-10 DIAGNOSIS — Z6841 Body Mass Index (BMI) 40.0 and over, adult: Secondary | ICD-10-CM

## 2017-05-10 DIAGNOSIS — Y846 Urinary catheterization as the cause of abnormal reaction of the patient, or of later complication, without mention of misadventure at the time of the procedure: Secondary | ICD-10-CM | POA: Diagnosis present

## 2017-05-10 DIAGNOSIS — Z86718 Personal history of other venous thrombosis and embolism: Secondary | ICD-10-CM

## 2017-05-10 DIAGNOSIS — Z8249 Family history of ischemic heart disease and other diseases of the circulatory system: Secondary | ICD-10-CM

## 2017-05-10 DIAGNOSIS — A419 Sepsis, unspecified organism: Secondary | ICD-10-CM | POA: Diagnosis present

## 2017-05-10 DIAGNOSIS — I10 Essential (primary) hypertension: Secondary | ICD-10-CM | POA: Diagnosis present

## 2017-05-10 DIAGNOSIS — N390 Urinary tract infection, site not specified: Secondary | ICD-10-CM | POA: Diagnosis present

## 2017-05-10 DIAGNOSIS — Z1612 Extended spectrum beta lactamase (ESBL) resistance: Secondary | ICD-10-CM | POA: Diagnosis present

## 2017-05-10 DIAGNOSIS — B9620 Unspecified Escherichia coli [E. coli] as the cause of diseases classified elsewhere: Secondary | ICD-10-CM | POA: Diagnosis present

## 2017-05-10 DIAGNOSIS — Z7901 Long term (current) use of anticoagulants: Secondary | ICD-10-CM

## 2017-05-10 DIAGNOSIS — Z9359 Other cystostomy status: Secondary | ICD-10-CM

## 2017-05-10 DIAGNOSIS — S24102S Unspecified injury at T2-T6 level of thoracic spinal cord, sequela: Secondary | ICD-10-CM

## 2017-05-10 DIAGNOSIS — R319 Hematuria, unspecified: Secondary | ICD-10-CM

## 2017-05-10 NOTE — ED Triage Notes (Signed)
Ems pt from home pt has a history of frequent UTI without symptoms due to SCI. Pt reports woke up this morning with dark urine and drainage around the suprapubic insertion site. Pt feels like he has a fever
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
## 2017-05-11 ENCOUNTER — Emergency Department (HOSPITAL_COMMUNITY): Payer: Medicaid Other

## 2017-05-11 DIAGNOSIS — Z1612 Extended spectrum beta lactamase (ESBL) resistance: Secondary | ICD-10-CM | POA: Diagnosis present

## 2017-05-11 DIAGNOSIS — Z8249 Family history of ischemic heart disease and other diseases of the circulatory system: Secondary | ICD-10-CM | POA: Diagnosis not present

## 2017-05-11 DIAGNOSIS — Z7901 Long term (current) use of anticoagulants: Secondary | ICD-10-CM | POA: Diagnosis not present

## 2017-05-11 DIAGNOSIS — Z87440 Personal history of urinary (tract) infections: Secondary | ICD-10-CM | POA: Diagnosis not present

## 2017-05-11 DIAGNOSIS — Z86718 Personal history of other venous thrombosis and embolism: Secondary | ICD-10-CM | POA: Diagnosis not present

## 2017-05-11 DIAGNOSIS — N390 Urinary tract infection, site not specified: Secondary | ICD-10-CM

## 2017-05-11 DIAGNOSIS — A419 Sepsis, unspecified organism: Secondary | ICD-10-CM

## 2017-05-11 DIAGNOSIS — R112 Nausea with vomiting, unspecified: Secondary | ICD-10-CM

## 2017-05-11 DIAGNOSIS — B961 Klebsiella pneumoniae [K. pneumoniae] as the cause of diseases classified elsewhere: Secondary | ICD-10-CM | POA: Diagnosis present

## 2017-05-11 DIAGNOSIS — S24102S Unspecified injury at T2-T6 level of thoracic spinal cord, sequela: Secondary | ICD-10-CM | POA: Diagnosis not present

## 2017-05-11 DIAGNOSIS — Y846 Urinary catheterization as the cause of abnormal reaction of the patient, or of later complication, without mention of misadventure at the time of the procedure: Secondary | ICD-10-CM | POA: Diagnosis present

## 2017-05-11 DIAGNOSIS — Z9359 Other cystostomy status: Secondary | ICD-10-CM | POA: Diagnosis not present

## 2017-05-11 DIAGNOSIS — Z8619 Personal history of other infectious and parasitic diseases: Secondary | ICD-10-CM | POA: Diagnosis not present

## 2017-05-11 DIAGNOSIS — I10 Essential (primary) hypertension: Secondary | ICD-10-CM | POA: Diagnosis present

## 2017-05-11 DIAGNOSIS — G8221 Paraplegia, complete: Secondary | ICD-10-CM | POA: Diagnosis present

## 2017-05-11 DIAGNOSIS — Y92009 Unspecified place in unspecified non-institutional (private) residence as the place of occurrence of the external cause: Secondary | ICD-10-CM | POA: Diagnosis not present

## 2017-05-11 DIAGNOSIS — Z6841 Body Mass Index (BMI) 40.0 and over, adult: Secondary | ICD-10-CM | POA: Diagnosis not present

## 2017-05-11 DIAGNOSIS — T83511A Infection and inflammatory reaction due to indwelling urethral catheter, initial encounter: Secondary | ICD-10-CM | POA: Diagnosis not present

## 2017-05-11 DIAGNOSIS — B9620 Unspecified Escherichia coli [E. coli] as the cause of diseases classified elsewhere: Secondary | ICD-10-CM | POA: Diagnosis present

## 2017-05-11 LAB — CBC WITH DIFFERENTIAL/PLATELET
Basophils Absolute: 0 K/uL (ref 0.0–0.1)
Basophils Relative: 0 %
Eosinophils Absolute: 0 K/uL (ref 0.0–0.7)
Eosinophils Relative: 0 %
HCT: 43.5 % (ref 39.0–52.0)
Hemoglobin: 13.7 g/dL (ref 13.0–17.0)
Lymphocytes Relative: 13 %
Lymphs Abs: 1.2 K/uL (ref 0.7–4.0)
MCH: 22.9 pg — ABNORMAL LOW (ref 26.0–34.0)
MCHC: 31.5 g/dL (ref 30.0–36.0)
MCV: 72.9 fL — ABNORMAL LOW (ref 78.0–100.0)
Monocytes Absolute: 0.7 K/uL (ref 0.1–1.0)
Monocytes Relative: 7 %
Neutro Abs: 7.6 K/uL (ref 1.7–7.7)
Neutrophils Relative %: 80 %
Platelets: UNDETERMINED K/uL (ref 150–400)
RBC: 5.97 MIL/uL — ABNORMAL HIGH (ref 4.22–5.81)
RDW: 14.9 % (ref 11.5–15.5)
WBC: 9.5 K/uL (ref 4.0–10.5)

## 2017-05-11 LAB — URINALYSIS, ROUTINE W REFLEX MICROSCOPIC
Bilirubin Urine: NEGATIVE
Glucose, UA: NEGATIVE mg/dL
Ketones, ur: NEGATIVE mg/dL
Nitrite: NEGATIVE
Protein, ur: 100 mg/dL — AB
Specific Gravity, Urine: 1.011 (ref 1.005–1.030)
Squamous Epithelial / HPF: NONE SEEN
pH: 7 (ref 5.0–8.0)

## 2017-05-11 LAB — I-STAT CG4 LACTIC ACID, ED
Lactic Acid, Venous: 1.16 mmol/L (ref 0.5–1.9)
Lactic Acid, Venous: 1.42 mmol/L (ref 0.5–1.9)

## 2017-05-11 LAB — COMPREHENSIVE METABOLIC PANEL
ALK PHOS: 67 U/L (ref 38–126)
ALT: 22 U/L (ref 17–63)
ANION GAP: 11 (ref 5–15)
AST: 19 U/L (ref 15–41)
Albumin: 4.7 g/dL (ref 3.5–5.0)
BILIRUBIN TOTAL: 1 mg/dL (ref 0.3–1.2)
BUN: 18 mg/dL (ref 6–20)
CALCIUM: 10.2 mg/dL (ref 8.9–10.3)
CO2: 28 mmol/L (ref 22–32)
Chloride: 102 mmol/L (ref 101–111)
Creatinine, Ser: 1.17 mg/dL (ref 0.61–1.24)
GFR calc non Af Amer: >60 mL/min (ref >60)
Glucose, Bld: 154 mg/dL — ABNORMAL HIGH (ref 65–99)
POTASSIUM: 3.8 mmol/L (ref 3.5–5.1)
SODIUM: 141 mmol/L (ref 135–145)
TOTAL PROTEIN: 8.3 g/dL — AB (ref 6.5–8.1)

## 2017-05-11 LAB — TROPONIN I
Troponin I: <0.03 ng/mL (ref <0.03)
Troponin I: <0.03 ng/mL (ref <0.03)

## 2017-05-11 LAB — PROTIME-INR
INR: 1.72
PROTHROMBIN TIME: 20.3 s — AB (ref 11.4–15.2)

## 2017-05-11 LAB — PROCALCITONIN: Procalcitonin: 0.15 ng/mL

## 2017-05-11 MED ORDER — CYCLOBENZAPRINE HCL 5 MG PO TABS
5.0000 mg | ORAL_TABLET | Freq: Three times a day (TID) | ORAL | Status: DC | PRN
  Administered 2017-05-11: 5 mg via ORAL
  Filled 2017-05-11: qty 1

## 2017-05-11 MED ORDER — SODIUM CHLORIDE 0.9 % IV BOLUS (SEPSIS): 500.0000 mL | Freq: Once | INTRAVENOUS | Status: DC

## 2017-05-11 MED ORDER — SODIUM CHLORIDE 0.9 % IV BOLUS (SEPSIS)
2000.0000 mL | Freq: Once | INTRAVENOUS | Status: AC
  Administered 2017-05-11: 2000 mL via INTRAVENOUS

## 2017-05-11 MED ORDER — SODIUM CHLORIDE 0.9 % IV SOLN
INTRAVENOUS | Status: DC
  Administered 2017-05-11 – 2017-05-14 (×10): via INTRAVENOUS

## 2017-05-11 MED ORDER — ACETAMINOPHEN 325 MG PO TABS
650.0000 mg | ORAL_TABLET | Freq: Four times a day (QID) | ORAL | Status: DC | PRN
  Filled 2017-05-11: qty 2

## 2017-05-11 MED ORDER — ONDANSETRON HCL 4 MG/2ML IJ SOLN
4.0000 mg | Freq: Four times a day (QID) | INTRAMUSCULAR | Status: DC | PRN
  Administered 2017-05-11 (×2): 4 mg via INTRAVENOUS
  Filled 2017-05-11 (×2): qty 2

## 2017-05-11 MED ORDER — ALBUTEROL SULFATE (2.5 MG/3ML) 0.083% IN NEBU: 2.5000 mg | INHALATION_SOLUTION | RESPIRATORY_TRACT | Status: DC | PRN

## 2017-05-11 MED ORDER — SODIUM CHLORIDE 0.9% FLUSH
3.0000 mL | Freq: Two times a day (BID) | INTRAVENOUS | Status: DC
  Administered 2017-05-11 – 2017-05-13 (×3): 3 mL via INTRAVENOUS

## 2017-05-11 MED ORDER — SODIUM CHLORIDE 0.9 % IV SOLN
1.0000 g | Freq: Three times a day (TID) | INTRAVENOUS | Status: DC
  Administered 2017-05-11 – 2017-05-14 (×10): 1 g via INTRAVENOUS
  Filled 2017-05-11 (×11): qty 1

## 2017-05-11 MED ORDER — RIVAROXABAN 20 MG PO TABS
20.0000 mg | ORAL_TABLET | Freq: Every day | ORAL | Status: DC
  Administered 2017-05-11 – 2017-05-13 (×3): 20 mg via ORAL
  Filled 2017-05-11 (×4): qty 1

## 2017-05-11 MED ORDER — FERROUS SULFATE 325 (65 FE) MG PO TABS
325.0000 mg | ORAL_TABLET | Freq: Three times a day (TID) | ORAL | Status: DC
  Administered 2017-05-11 – 2017-05-14 (×11): 325 mg via ORAL
  Filled 2017-05-11 (×11): qty 1

## 2017-05-11 MED ORDER — OXYCODONE HCL 5 MG PO TABS
10.0000 mg | ORAL_TABLET | Freq: Four times a day (QID) | ORAL | Status: DC | PRN
  Administered 2017-05-11 – 2017-05-13 (×4): 10 mg via ORAL
  Filled 2017-05-11 (×4): qty 2

## 2017-05-11 MED ORDER — ONDANSETRON HCL 4 MG PO TABS: 4.0000 mg | ORAL_TABLET | Freq: Four times a day (QID) | ORAL | Status: DC | PRN

## 2017-05-11 MED ORDER — ACETAMINOPHEN 650 MG RE SUPP: 650.0000 mg | Freq: Four times a day (QID) | RECTAL | Status: DC | PRN

## 2017-05-11 MED ORDER — HYDRALAZINE HCL 20 MG/ML IJ SOLN
10.0000 mg | INTRAMUSCULAR | Status: DC | PRN
  Administered 2017-05-11: 10 mg via INTRAVENOUS
  Filled 2017-05-11: qty 1

## 2017-05-11 MED ORDER — DEXTROSE 5 % IV SOLN
2.0000 g | Freq: Once | INTRAVENOUS | Status: AC
  Administered 2017-05-11: 2 g via INTRAVENOUS
  Filled 2017-05-11: qty 2
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
## 2017-05-11 NOTE — ED Notes (Addendum)
edp at bedside  

## 2017-05-11 NOTE — Progress Notes (Signed)
Pharmacy Antibiotic Note  Marshell GarfinkelKenneth July is a 46 y.o. male admitted on 05/10/2017 with UTI.  Pharmacy has been consulted for Merrem dosing. Pt has a suprapubic catheter in place and woke up this AM with dark urine/drainage around the site. Pt has a hx of ESBL E Coli UTI. WBC WNL. Renal function good.   Plan: -Merrem 1g IV q8h -Trend WBC, temp, renal function  -F/U urine culture for directed therapy  Height: 6\' 1"  (185.4 cm) Weight: 300 lb (136.1 kg) IBW/kg (Calculated) : 79.9  Temp (24hrs), Avg:97.3 F (36.3 C), Min:97.3 F (36.3 C), Max:97.3 F (36.3 C)   Recent Labs Lab 05/11/17 0002 05/11/17 0013  WBC 9.5  --   CREATININE 1.17  --   LATICACIDVEN  --  1.42    Estimated Creatinine Clearance: 115.5 mL/min (by C-G formula based on SCr of 1.17 mg/dL).    No Known Allergies   [name], Kervens Roper 05/11/2017 2:31 AM

## 2017-05-11 NOTE — Progress Notes (Signed)
BP 96/62 HR 98 MD notified via text page.

## 2017-05-12 LAB — CBC
HCT: 33.9 % — ABNORMAL LOW (ref 39.0–52.0)
HEMOGLOBIN: 10 g/dL — AB (ref 13.0–17.0)
MCH: 21.6 pg — ABNORMAL LOW (ref 26.0–34.0)
MCHC: 29.5 g/dL — AB (ref 30.0–36.0)
MCV: 73.4 fL — ABNORMAL LOW (ref 78.0–100.0)
Platelets: UNDETERMINED 10*3/uL (ref 150–400)
RBC: 4.62 MIL/uL (ref 4.22–5.81)
RDW: 15.1 % (ref 11.5–15.5)
WBC: 5.7 10*3/uL (ref 4.0–10.5)

## 2017-05-12 LAB — BASIC METABOLIC PANEL
ANION GAP: 6 (ref 5–15)
BUN: 18 mg/dL (ref 6–20)
CALCIUM: 8.3 mg/dL — AB (ref 8.9–10.3)
CO2: 28 mmol/L (ref 22–32)
Chloride: 101 mmol/L (ref 101–111)
Creatinine, Ser: 1.15 mg/dL (ref 0.61–1.24)
GLUCOSE: 117 mg/dL — AB (ref 65–99)
POTASSIUM: 3.2 mmol/L — AB (ref 3.5–5.1)
SODIUM: 135 mmol/L (ref 135–145)

## 2017-05-12 MED ORDER — POTASSIUM CHLORIDE CRYS ER 20 MEQ PO TBCR
40.0000 meq | EXTENDED_RELEASE_TABLET | Freq: Once | ORAL | Status: AC
Start: 2017-05-12 — End: 2017-05-12
  Administered 2017-05-12: 40 meq via ORAL
  Filled 2017-05-12: qty 2
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
## 2017-05-13 LAB — BASIC METABOLIC PANEL
ANION GAP: 9 (ref 5–15)
BUN: 13 mg/dL (ref 6–20)
CHLORIDE: 104 mmol/L (ref 101–111)
CO2: 24 mmol/L (ref 22–32)
Calcium: 8.4 mg/dL — ABNORMAL LOW (ref 8.9–10.3)
Creatinine, Ser: 0.87 mg/dL (ref 0.61–1.24)
Glucose, Bld: 119 mg/dL — ABNORMAL HIGH (ref 65–99)
POTASSIUM: 4.5 mmol/L (ref 3.5–5.1)
SODIUM: 137 mmol/L (ref 135–145)

## 2017-05-13 LAB — CBC
HEMATOCRIT: 36.6 % — AB (ref 39.0–52.0)
HEMOGLOBIN: 11.1 g/dL — AB (ref 13.0–17.0)
MCH: 22.2 pg — ABNORMAL LOW (ref 26.0–34.0)
MCHC: 30.3 g/dL (ref 30.0–36.0)
MCV: 73.1 fL — AB (ref 78.0–100.0)
Platelets: 120 10*3/uL — ABNORMAL LOW (ref 150–400)
RBC: 5.01 MIL/uL (ref 4.22–5.81)
RDW: 15 % (ref 11.5–15.5)
WBC: 4.7 10*3/uL (ref 4.0–10.5)
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
## 2017-05-14 LAB — URINE CULTURE

## 2017-05-14 LAB — CBC
HCT: 34.9 % — ABNORMAL LOW (ref 39.0–52.0)
Hemoglobin: 10.6 g/dL — ABNORMAL LOW (ref 13.0–17.0)
MCH: 22.1 pg — ABNORMAL LOW (ref 26.0–34.0)
MCHC: 30.4 g/dL (ref 30.0–36.0)
MCV: 72.9 fL — ABNORMAL LOW (ref 78.0–100.0)
Platelets: 165 10*3/uL (ref 150–400)
RBC: 4.79 MIL/uL (ref 4.22–5.81)
RDW: 14.8 % (ref 11.5–15.5)
WBC: 4 10*3/uL (ref 4.0–10.5)

## 2017-05-14 LAB — BASIC METABOLIC PANEL
Anion gap: 7 (ref 5–15)
BUN: 10 mg/dL (ref 6–20)
CO2: 26 mmol/L (ref 22–32)
CREATININE: 0.84 mg/dL (ref 0.61–1.24)
Calcium: 8.5 mg/dL — ABNORMAL LOW (ref 8.9–10.3)
Chloride: 104 mmol/L (ref 101–111)
GFR calc Af Amer: >60 mL/min (ref >60)
GLUCOSE: 107 mg/dL — AB (ref 65–99)
Potassium: 3.9 mmol/L (ref 3.5–5.1)
SODIUM: 137 mmol/L (ref 135–145)

## 2017-05-14 MED ORDER — FOSFOMYCIN TROMETHAMINE 3 G PO PACK
3.0000 g | PACK | Freq: Once | ORAL | Status: AC
  Administered 2017-05-14: 3 g via ORAL
  Filled 2017-05-14: qty 3

## 2017-05-14 MED ORDER — CYCLOBENZAPRINE HCL 5 MG PO TABS: 5.0000 mg | ORAL_TABLET | Freq: Three times a day (TID) | ORAL | 1 refills | Status: DC | PRN

## 2017-05-14 MED ORDER — FERROUS SULFATE 325 (65 FE) MG PO TABS: 325.0000 mg | ORAL_TABLET | Freq: Three times a day (TID) | ORAL | 0 refills | Status: AC
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
## 2017-05-16 LAB — CULTURE, BLOOD (ROUTINE X 2)
CULTURE: NO GROWTH
Culture: NO GROWTH
SPECIAL REQUESTS: ADEQUATE
Special Requests: ADEQUATE

## 2017-07-20 ENCOUNTER — Inpatient Hospital Stay (HOSPITAL_COMMUNITY)
Admission: EM | Admit: 2017-07-20 | Discharge: 2017-07-23 | DRG: 699 | Disposition: A | Discharge status: Home / Self Care | Payer: Medicaid Other | Attending: Internal Medicine | Admitting: Internal Medicine

## 2017-07-20 ENCOUNTER — Encounter (HOSPITAL_COMMUNITY): Payer: Self-pay

## 2017-07-20 DIAGNOSIS — T83510A Infection and inflammatory reaction due to cystostomy catheter, initial encounter: Principal | ICD-10-CM | POA: Diagnosis present

## 2017-07-20 DIAGNOSIS — R112 Nausea with vomiting, unspecified: Secondary | ICD-10-CM | POA: Diagnosis not present

## 2017-07-20 DIAGNOSIS — Z7901 Long term (current) use of anticoagulants: Secondary | ICD-10-CM

## 2017-07-20 DIAGNOSIS — Z86718 Personal history of other venous thrombosis and embolism: Secondary | ICD-10-CM

## 2017-07-20 DIAGNOSIS — Z9359 Other cystostomy status: Secondary | ICD-10-CM

## 2017-07-20 DIAGNOSIS — N390 Urinary tract infection, site not specified: Secondary | ICD-10-CM

## 2017-07-20 DIAGNOSIS — Z8619 Personal history of other infectious and parasitic diseases: Secondary | ICD-10-CM

## 2017-07-20 DIAGNOSIS — N319 Neuromuscular dysfunction of bladder, unspecified: Secondary | ICD-10-CM | POA: Diagnosis present

## 2017-07-20 DIAGNOSIS — Z87440 Personal history of urinary (tract) infections: Secondary | ICD-10-CM

## 2017-07-20 DIAGNOSIS — Z933 Colostomy status: Secondary | ICD-10-CM

## 2017-07-20 DIAGNOSIS — Z79899 Other long term (current) drug therapy: Secondary | ICD-10-CM

## 2017-07-20 DIAGNOSIS — T83511A Infection and inflammatory reaction due to indwelling urethral catheter, initial encounter: Secondary | ICD-10-CM

## 2017-07-20 DIAGNOSIS — G8220 Paraplegia, unspecified: Secondary | ICD-10-CM | POA: Diagnosis present

## 2017-07-20 DIAGNOSIS — I10 Essential (primary) hypertension: Secondary | ICD-10-CM | POA: Diagnosis present

## 2017-07-20 DIAGNOSIS — B964 Proteus (mirabilis) (morganii) as the cause of diseases classified elsewhere: Secondary | ICD-10-CM | POA: Diagnosis present

## 2017-07-20 DIAGNOSIS — Y846 Urinary catheterization as the cause of abnormal reaction of the patient, or of later complication, without mention of misadventure at the time of the procedure: Secondary | ICD-10-CM | POA: Diagnosis present

## 2017-07-20 LAB — URINALYSIS, ROUTINE W REFLEX MICROSCOPIC
Bilirubin Urine: NEGATIVE
Glucose, UA: NEGATIVE mg/dL
Ketones, ur: NEGATIVE mg/dL
NITRITE: POSITIVE — AB
PH: 8 (ref 5.0–8.0)
Protein, ur: 100 mg/dL — AB
SPECIFIC GRAVITY, URINE: 1.015 (ref 1.005–1.030)

## 2017-07-20 LAB — URINALYSIS, MICROSCOPIC (REFLEX)

## 2017-07-20 LAB — CBC WITH DIFFERENTIAL/PLATELET
BASOS PCT: 0 %
Basophils Absolute: 0 10*3/uL (ref 0.0–0.1)
EOS ABS: 0 10*3/uL (ref 0.0–0.7)
EOS PCT: 0 %
HEMATOCRIT: 47.9 % (ref 39.0–52.0)
HEMOGLOBIN: 15.4 g/dL (ref 13.0–17.0)
LYMPHS PCT: 12 %
Lymphs Abs: 1.1 10*3/uL (ref 0.7–4.0)
MCH: 23.3 pg — AB (ref 26.0–34.0)
MCHC: 32.2 g/dL (ref 30.0–36.0)
MCV: 72.5 fL — ABNORMAL LOW (ref 78.0–100.0)
MONO ABS: 0.6 10*3/uL (ref 0.1–1.0)
Monocytes Relative: 6 %
NEUTROS PCT: 82 %
Neutro Abs: 7.5 10*3/uL (ref 1.7–7.7)
Platelets: ADEQUATE 10*3/uL (ref 150–400)
RBC: 6.61 MIL/uL — AB (ref 4.22–5.81)
RDW: 16.5 % — ABNORMAL HIGH (ref 11.5–15.5)
WBC: 9.2 10*3/uL (ref 4.0–10.5)

## 2017-07-20 LAB — COMPREHENSIVE METABOLIC PANEL
ALT: 32 U/L (ref 17–63)
ANION GAP: 10 (ref 5–15)
AST: 23 U/L (ref 15–41)
Albumin: 5 g/dL (ref 3.5–5.0)
Alkaline Phosphatase: 75 U/L (ref 38–126)
BUN: 19 mg/dL (ref 6–20)
CHLORIDE: 109 mmol/L (ref 101–111)
CO2: 23 mmol/L (ref 22–32)
CREATININE: 1.03 mg/dL (ref 0.61–1.24)
Calcium: 9.8 mg/dL (ref 8.9–10.3)
Glucose, Bld: 147 mg/dL — ABNORMAL HIGH (ref 65–99)
POTASSIUM: 3.9 mmol/L (ref 3.5–5.1)
SODIUM: 142 mmol/L (ref 135–145)
Total Bilirubin: 0.6 mg/dL (ref 0.3–1.2)
Total Protein: 8.7 g/dL — ABNORMAL HIGH (ref 6.5–8.1)

## 2017-07-20 LAB — I-STAT CG4 LACTIC ACID, ED: LACTIC ACID, VENOUS: 1.77 mmol/L (ref 0.5–1.9)

## 2017-07-20 MED ORDER — ONDANSETRON HCL 4 MG/2ML IJ SOLN: 4.0000 mg | Freq: Four times a day (QID) | INTRAMUSCULAR | Status: DC | PRN

## 2017-07-20 MED ORDER — SODIUM CHLORIDE 0.9 % IV SOLN
1.0000 g | Freq: Three times a day (TID) | INTRAVENOUS | Status: DC
  Administered 2017-07-20 – 2017-07-23 (×9): 1 g via INTRAVENOUS
  Filled 2017-07-20 (×9): qty 1

## 2017-07-20 MED ORDER — ONDANSETRON HCL 4 MG/2ML IJ SOLN
4.0000 mg | Freq: Once | INTRAMUSCULAR | Status: AC
  Administered 2017-07-20: 4 mg via INTRAVENOUS
  Filled 2017-07-20: qty 2

## 2017-07-20 MED ORDER — OXYCODONE HCL 5 MG PO TABS
15.0000 mg | ORAL_TABLET | Freq: Four times a day (QID) | ORAL | Status: DC | PRN
  Administered 2017-07-20 – 2017-07-23 (×6): 15 mg via ORAL
  Filled 2017-07-20 (×7): qty 3

## 2017-07-20 MED ORDER — ONDANSETRON HCL 4 MG PO TABS: 4.0000 mg | ORAL_TABLET | Freq: Four times a day (QID) | ORAL | Status: DC | PRN

## 2017-07-20 MED ORDER — ACETAMINOPHEN 325 MG PO TABS: 650.0000 mg | ORAL_TABLET | Freq: Four times a day (QID) | ORAL | Status: DC | PRN

## 2017-07-20 MED ORDER — RIVAROXABAN 20 MG PO TABS
20.0000 mg | ORAL_TABLET | Freq: Every day | ORAL | Status: DC
  Administered 2017-07-20 – 2017-07-22 (×3): 20 mg via ORAL
  Filled 2017-07-20 (×3): qty 1

## 2017-07-20 MED ORDER — FUROSEMIDE 20 MG PO TABS
20.0000 mg | ORAL_TABLET | Freq: Every day | ORAL | Status: DC
  Administered 2017-07-20 – 2017-07-23 (×4): 20 mg via ORAL
  Filled 2017-07-20 (×4): qty 1

## 2017-07-20 MED ORDER — ACETAMINOPHEN 650 MG RE SUPP: 650.0000 mg | Freq: Four times a day (QID) | RECTAL | Status: DC | PRN

## 2017-07-20 MED ORDER — RIVAROXABAN 20 MG PO TABS: 20.0000 mg | ORAL_TABLET | Freq: Every day | ORAL | Status: DC

## 2017-07-20 MED ORDER — SODIUM CHLORIDE 0.9 % IV SOLN
1000.0000 mL | INTRAVENOUS | Status: AC
  Administered 2017-07-20 – 2017-07-21 (×2): 1000 mL via INTRAVENOUS
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
## 2017-07-20 NOTE — ED Notes (Signed)
Pt given cranberry juice per Albin Felling, RN

## 2017-07-20 NOTE — Progress Notes (Signed)
Pharmacy Antibiotic Note  Charles Poole is a 46 y.o. male admitted on 07/20/2017 with UTI.  Pharmacy has been consulted for meropenem dosing. Known history of ESBL infection. Pt is afebrile and WBC Is WNL. SCr is WNL at 1.03.   Plan: Meropenem 1gm IV Q8H F/u renal fxn, C&S, clinical status   Height:  (185.4 cm) Weight: (!) 310 lb (140.6 kg) IBW/kg (Calculated) : 79.9  Temp (24hrs), Avg:98.6 F (37 C), Min:98.6 F (37 C), Max:98.6 F (37 C)   Recent Labs Lab 07/20/17 1403  WBC 9.2  CREATININE 1.03    Estimated Creatinine Clearance: 133.5 mL/min (by C-G formula based on SCr of 1.03 mg/dL).    No Known Allergies  Antimicrobials this admission: Meropenem 10/13>>  Dose adjustments this admission: N/A  Microbiology results: Pending  Thank you for allowing pharmacy to be a part of this patient's care.  Yarexi Pawlicki, Drake Leach 07/20/2017 4:28 PM

## 2017-07-20 NOTE — ED Notes (Signed)
Pt made aware of bed assignment 

## 2017-07-20 NOTE — ED Notes (Signed)
Report attempted 
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
## 2017-07-20 NOTE — ED Notes (Signed)
Colostomy bag leaking d/t pt sweating.  Set up cleaning supplies for pt to take colostomy bag off and bathe.

## 2017-07-21 ENCOUNTER — Encounter (HOSPITAL_COMMUNITY): Payer: Self-pay | Admitting: Internal Medicine

## 2017-07-21 DIAGNOSIS — I10 Essential (primary) hypertension: Secondary | ICD-10-CM | POA: Diagnosis present

## 2017-07-21 DIAGNOSIS — N390 Urinary tract infection, site not specified: Secondary | ICD-10-CM | POA: Diagnosis not present

## 2017-07-21 DIAGNOSIS — B964 Proteus (mirabilis) (morganii) as the cause of diseases classified elsewhere: Secondary | ICD-10-CM | POA: Diagnosis present

## 2017-07-21 DIAGNOSIS — Z933 Colostomy status: Secondary | ICD-10-CM | POA: Diagnosis not present

## 2017-07-21 DIAGNOSIS — Y838 Other surgical procedures as the cause of abnormal reaction of the patient, or of later complication, without mention of misadventure at the time of the procedure: Secondary | ICD-10-CM | POA: Diagnosis not present

## 2017-07-21 DIAGNOSIS — Z86718 Personal history of other venous thrombosis and embolism: Secondary | ICD-10-CM | POA: Diagnosis not present

## 2017-07-21 DIAGNOSIS — T83510A Infection and inflammatory reaction due to cystostomy catheter, initial encounter: Principal | ICD-10-CM

## 2017-07-21 DIAGNOSIS — T83510D Infection and inflammatory reaction due to cystostomy catheter, subsequent encounter: Secondary | ICD-10-CM | POA: Diagnosis not present

## 2017-07-21 DIAGNOSIS — G8220 Paraplegia, unspecified: Secondary | ICD-10-CM | POA: Diagnosis present

## 2017-07-21 DIAGNOSIS — T83511A Infection and inflammatory reaction due to indwelling urethral catheter, initial encounter: Secondary | ICD-10-CM | POA: Diagnosis not present

## 2017-07-21 DIAGNOSIS — N319 Neuromuscular dysfunction of bladder, unspecified: Secondary | ICD-10-CM | POA: Diagnosis present

## 2017-07-21 DIAGNOSIS — Z87440 Personal history of urinary (tract) infections: Secondary | ICD-10-CM | POA: Diagnosis not present

## 2017-07-21 DIAGNOSIS — Z79899 Other long term (current) drug therapy: Secondary | ICD-10-CM | POA: Diagnosis not present

## 2017-07-21 DIAGNOSIS — Z9359 Other cystostomy status: Secondary | ICD-10-CM | POA: Diagnosis not present

## 2017-07-21 DIAGNOSIS — Y846 Urinary catheterization as the cause of abnormal reaction of the patient, or of later complication, without mention of misadventure at the time of the procedure: Secondary | ICD-10-CM | POA: Diagnosis present

## 2017-07-21 DIAGNOSIS — Z7901 Long term (current) use of anticoagulants: Secondary | ICD-10-CM | POA: Diagnosis not present

## 2017-07-21 LAB — CBC
HCT: 35.4 % — ABNORMAL LOW (ref 39.0–52.0)
Hemoglobin: 11.2 g/dL — ABNORMAL LOW (ref 13.0–17.0)
MCH: 23.1 pg — ABNORMAL LOW (ref 26.0–34.0)
MCHC: 31.6 g/dL (ref 30.0–36.0)
MCV: 73 fL — ABNORMAL LOW (ref 78.0–100.0)
Platelets: 114 10*3/uL — ABNORMAL LOW (ref 150–400)
RBC: 4.85 MIL/uL (ref 4.22–5.81)
RDW: 17.2 % — ABNORMAL HIGH (ref 11.5–15.5)
WBC: 4.8 10*3/uL (ref 4.0–10.5)

## 2017-07-21 LAB — BASIC METABOLIC PANEL
Anion gap: 5 (ref 5–15)
BUN: 15 mg/dL (ref 6–20)
CO2: 26 mmol/L (ref 22–32)
Calcium: 8.7 mg/dL — ABNORMAL LOW (ref 8.9–10.3)
Chloride: 106 mmol/L (ref 101–111)
Creatinine, Ser: 0.91 mg/dL (ref 0.61–1.24)
GFR calc Af Amer: >60 mL/min (ref >60)
GFR calc non Af Amer: >60 mL/min (ref >60)
Glucose, Bld: 103 mg/dL — ABNORMAL HIGH (ref 65–99)
Potassium: 3.7 mmol/L (ref 3.5–5.1)
Sodium: 137 mmol/L (ref 135–145)

## 2017-07-21 LAB — HIV ANTIBODY (ROUTINE TESTING W REFLEX): HIV Screen 4th Generation wRfx: NONREACTIVE
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
## 2017-07-22 DIAGNOSIS — T83510D Infection and inflammatory reaction due to cystostomy catheter, subsequent encounter: Secondary | ICD-10-CM

## 2017-07-22 LAB — BLOOD CULTURE ID PANEL (REFLEXED)
Acinetobacter baumannii: NOT DETECTED
CANDIDA KRUSEI: NOT DETECTED
CANDIDA PARAPSILOSIS: NOT DETECTED
CANDIDA TROPICALIS: NOT DETECTED
Candida albicans: NOT DETECTED
Candida glabrata: NOT DETECTED
ESCHERICHIA COLI: NOT DETECTED
Enterobacter cloacae complex: NOT DETECTED
Enterobacteriaceae species: NOT DETECTED
Enterococcus species: NOT DETECTED
HAEMOPHILUS INFLUENZAE: NOT DETECTED
KLEBSIELLA OXYTOCA: NOT DETECTED
KLEBSIELLA PNEUMONIAE: NOT DETECTED
Listeria monocytogenes: NOT DETECTED
Neisseria meningitidis: NOT DETECTED
PROTEUS SPECIES: NOT DETECTED
Pseudomonas aeruginosa: NOT DETECTED
SERRATIA MARCESCENS: NOT DETECTED
STAPHYLOCOCCUS AUREUS BCID: NOT DETECTED
STREPTOCOCCUS SPECIES: NOT DETECTED
Staphylococcus species: NOT DETECTED
Streptococcus agalactiae: NOT DETECTED
Streptococcus pneumoniae: NOT DETECTED
Streptococcus pyogenes: NOT DETECTED

## 2017-07-22 LAB — CBC
HCT: 34.6 % — ABNORMAL LOW (ref 39.0–52.0)
Hemoglobin: 10.5 g/dL — ABNORMAL LOW (ref 13.0–17.0)
MCH: 22.2 pg — ABNORMAL LOW (ref 26.0–34.0)
MCHC: 30.3 g/dL (ref 30.0–36.0)
MCV: 73.2 fL — ABNORMAL LOW (ref 78.0–100.0)
Platelets: 136 10*3/uL — ABNORMAL LOW (ref 150–400)
RBC: 4.73 MIL/uL (ref 4.22–5.81)
RDW: 16.8 % — ABNORMAL HIGH (ref 11.5–15.5)
WBC: 4.2 10*3/uL (ref 4.0–10.5)
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
## 2017-07-22 NOTE — Progress Notes (Signed)
PHARMACY - PHYSICIAN COMMUNICATION CRITICAL VALUE ALERT - BLOOD CULTURE IDENTIFICATION (BCID)  Results for orders placed or performed during the hospital encounter of 07/20/17  Blood Culture ID Panel (Reflexed) (Collected: 07/20/2017  2:25 PM)  Result Value Ref Range   Enterococcus species NOT DETECTED NOT DETECTED   Listeria monocytogenes NOT DETECTED NOT DETECTED   Staphylococcus species NOT DETECTED NOT DETECTED   Staphylococcus aureus NOT DETECTED NOT DETECTED   Streptococcus species NOT DETECTED NOT DETECTED   Streptococcus agalactiae NOT DETECTED NOT DETECTED   Streptococcus pneumoniae NOT DETECTED NOT DETECTED   Streptococcus pyogenes NOT DETECTED NOT DETECTED   Acinetobacter baumannii NOT DETECTED NOT DETECTED   Enterobacteriaceae species NOT DETECTED NOT DETECTED   Enterobacter cloacae complex NOT DETECTED NOT DETECTED   Escherichia coli NOT DETECTED NOT DETECTED   Klebsiella oxytoca NOT DETECTED NOT DETECTED   Klebsiella pneumoniae NOT DETECTED NOT DETECTED   Proteus species NOT DETECTED NOT DETECTED   Serratia marcescens NOT DETECTED NOT DETECTED   Haemophilus influenzae NOT DETECTED NOT DETECTED   Neisseria meningitidis NOT DETECTED NOT DETECTED   Pseudomonas aeruginosa NOT DETECTED NOT DETECTED   Candida albicans NOT DETECTED NOT DETECTED   Candida glabrata NOT DETECTED NOT DETECTED   Candida krusei NOT DETECTED NOT DETECTED   Candida parapsilosis NOT DETECTED NOT DETECTED   Candida tropicalis NOT DETECTED NOT DETECTED    Name of physician (or Provider) Contacted: [facility]  Changes to prescribed antibiotics required: None- Blood culture 1/2 GPR on stain, BCID is negative.   Fayne Norrie 07/22/2017  10:31 PM

## 2017-07-23 DIAGNOSIS — Z9359 Other cystostomy status: Secondary | ICD-10-CM

## 2017-07-23 DIAGNOSIS — Y846 Urinary catheterization as the cause of abnormal reaction of the patient, or of later complication, without mention of misadventure at the time of the procedure: Secondary | ICD-10-CM

## 2017-07-23 DIAGNOSIS — G8220 Paraplegia, unspecified: Secondary | ICD-10-CM

## 2017-07-23 DIAGNOSIS — N390 Urinary tract infection, site not specified: Secondary | ICD-10-CM | POA: Diagnosis present

## 2017-07-23 DIAGNOSIS — B964 Proteus (mirabilis) (morganii) as the cause of diseases classified elsewhere: Secondary | ICD-10-CM

## 2017-07-23 DIAGNOSIS — T83511A Infection and inflammatory reaction due to indwelling urethral catheter, initial encounter: Secondary | ICD-10-CM

## 2017-07-23 DIAGNOSIS — Z86718 Personal history of other venous thrombosis and embolism: Secondary | ICD-10-CM

## 2017-07-23 LAB — CBC
HCT: 36.8 % — ABNORMAL LOW (ref 39.0–52.0)
Hemoglobin: 11.5 g/dL — ABNORMAL LOW (ref 13.0–17.0)
MCH: 22.5 pg — ABNORMAL LOW (ref 26.0–34.0)
MCHC: 31.3 g/dL (ref 30.0–36.0)
MCV: 72 fL — ABNORMAL LOW (ref 78.0–100.0)
Platelets: 127 10*3/uL — ABNORMAL LOW (ref 150–400)
RBC: 5.11 MIL/uL (ref 4.22–5.81)
RDW: 15.8 % — ABNORMAL HIGH (ref 11.5–15.5)
WBC: 3.9 10*3/uL — ABNORMAL LOW (ref 4.0–10.5)

## 2017-07-23 LAB — URINE CULTURE: Culture: >=100000 — AB

## 2017-07-23 MED ORDER — AMOXICILLIN 500 MG PO CAPS: 500.0000 mg | ORAL_CAPSULE | Freq: Three times a day (TID) | ORAL | Status: DC

## 2017-07-23 MED ORDER — AMOXICILLIN 500 MG PO CAPS: 500.0000 mg | ORAL_CAPSULE | Freq: Three times a day (TID) | ORAL | 0 refills | Status: DC
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
## 2017-07-23 NOTE — Discharge Summary (Signed)
Pt unable to get prescription from outpatient pharmacy for antibiotic. Patient thought it could be delivered but outpatient pharmacy not located here. Spoke to colleen from internal medicine residency and says will call back after 1pm. After MD gets out of meeting. Let her know even with prescription patient will be unable to get med until early morning

## 2017-07-23 NOTE — Discharge Instructions (Signed)

## 2017-07-23 NOTE — Discharge Summary (Signed)
Pt discharged home. Discharged home via [facility]. Pt is alert and oriented. Paraplegic. AVS discussed with patient and verbalizes understanding.
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
## 2017-07-25 LAB — CULTURE, BLOOD (ROUTINE X 2)
CULTURE: NO GROWTH
SPECIAL REQUESTS: ADEQUATE
Special Requests: ADEQUATE

## 2017-08-14 ENCOUNTER — Inpatient Hospital Stay (HOSPITAL_COMMUNITY)
Admission: EM | Admit: 2017-08-14 | Discharge: 2017-08-17 | DRG: 698 | Disposition: A | Discharge status: Home / Self Care | Payer: Medicaid Other | Attending: Internal Medicine | Admitting: Internal Medicine

## 2017-08-14 ENCOUNTER — Emergency Department (HOSPITAL_COMMUNITY): Payer: Medicaid Other

## 2017-08-14 DIAGNOSIS — A419 Sepsis, unspecified organism: Secondary | ICD-10-CM | POA: Diagnosis present

## 2017-08-14 DIAGNOSIS — G8929 Other chronic pain: Secondary | ICD-10-CM | POA: Diagnosis present

## 2017-08-14 DIAGNOSIS — Z8249 Family history of ischemic heart disease and other diseases of the circulatory system: Secondary | ICD-10-CM

## 2017-08-14 DIAGNOSIS — R0789 Other chest pain: Secondary | ICD-10-CM | POA: Diagnosis not present

## 2017-08-14 DIAGNOSIS — N390 Urinary tract infection, site not specified: Secondary | ICD-10-CM | POA: Diagnosis not present

## 2017-08-14 DIAGNOSIS — G8220 Paraplegia, unspecified: Secondary | ICD-10-CM | POA: Diagnosis present

## 2017-08-14 DIAGNOSIS — N10 Acute tubulo-interstitial nephritis: Secondary | ICD-10-CM | POA: Diagnosis not present

## 2017-08-14 DIAGNOSIS — Z7901 Long term (current) use of anticoagulants: Secondary | ICD-10-CM

## 2017-08-14 DIAGNOSIS — Z6841 Body Mass Index (BMI) 40.0 and over, adult: Secondary | ICD-10-CM

## 2017-08-14 DIAGNOSIS — Z87440 Personal history of urinary (tract) infections: Secondary | ICD-10-CM

## 2017-08-14 DIAGNOSIS — N319 Neuromuscular dysfunction of bladder, unspecified: Secondary | ICD-10-CM | POA: Diagnosis present

## 2017-08-14 DIAGNOSIS — N136 Pyonephrosis: Secondary | ICD-10-CM | POA: Diagnosis present

## 2017-08-14 DIAGNOSIS — B964 Proteus (mirabilis) (morganii) as the cause of diseases classified elsewhere: Secondary | ICD-10-CM | POA: Diagnosis present

## 2017-08-14 DIAGNOSIS — N1330 Unspecified hydronephrosis: Secondary | ICD-10-CM | POA: Diagnosis not present

## 2017-08-14 DIAGNOSIS — G8921 Chronic pain due to trauma: Secondary | ICD-10-CM | POA: Diagnosis not present

## 2017-08-14 DIAGNOSIS — I10 Essential (primary) hypertension: Secondary | ICD-10-CM | POA: Diagnosis present

## 2017-08-14 DIAGNOSIS — R6520 Severe sepsis without septic shock: Secondary | ICD-10-CM | POA: Diagnosis not present

## 2017-08-14 DIAGNOSIS — N179 Acute kidney failure, unspecified: Secondary | ICD-10-CM | POA: Diagnosis not present

## 2017-08-14 DIAGNOSIS — Z933 Colostomy status: Secondary | ICD-10-CM

## 2017-08-14 DIAGNOSIS — Y846 Urinary catheterization as the cause of abnormal reaction of the patient, or of later complication, without mention of misadventure at the time of the procedure: Secondary | ICD-10-CM | POA: Diagnosis present

## 2017-08-14 DIAGNOSIS — Z8619 Personal history of other infectious and parasitic diseases: Secondary | ICD-10-CM

## 2017-08-14 DIAGNOSIS — T83518A Infection and inflammatory reaction due to other urinary catheter, initial encounter: Principal | ICD-10-CM | POA: Diagnosis present

## 2017-08-14 DIAGNOSIS — E669 Obesity, unspecified: Secondary | ICD-10-CM | POA: Diagnosis present

## 2017-08-14 DIAGNOSIS — N3000 Acute cystitis without hematuria: Secondary | ICD-10-CM

## 2017-08-14 DIAGNOSIS — M546 Pain in thoracic spine: Secondary | ICD-10-CM | POA: Diagnosis not present

## 2017-08-14 DIAGNOSIS — Z960 Presence of urogenital implants: Secondary | ICD-10-CM | POA: Diagnosis not present

## 2017-08-14 LAB — URINALYSIS, ROUTINE W REFLEX MICROSCOPIC
BILIRUBIN URINE: NEGATIVE
GLUCOSE, UA: NEGATIVE mg/dL
KETONES UR: NEGATIVE mg/dL
NITRITE: NEGATIVE
PH: 9 — AB (ref 5.0–8.0)
Protein, ur: >=300 mg/dL — AB
Specific Gravity, Urine: 1.013 (ref 1.005–1.030)

## 2017-08-14 LAB — COMPREHENSIVE METABOLIC PANEL
ALK PHOS: 71 U/L (ref 38–126)
ALT: 34 U/L (ref 17–63)
ANION GAP: 9 (ref 5–15)
AST: 26 U/L (ref 15–41)
Albumin: 4.6 g/dL (ref 3.5–5.0)
BILIRUBIN TOTAL: 0.9 mg/dL (ref 0.3–1.2)
BUN: 22 mg/dL — AB (ref 6–20)
CALCIUM: 9.8 mg/dL (ref 8.9–10.3)
CO2: 25 mmol/L (ref 22–32)
Chloride: 107 mmol/L (ref 101–111)
Creatinine, Ser: 1.63 mg/dL — ABNORMAL HIGH (ref 0.61–1.24)
GFR calc Af Amer: 57 mL/min — ABNORMAL LOW (ref >60)
GFR, EST NON AFRICAN AMERICAN: 49 mL/min — AB (ref >60)
Glucose, Bld: 141 mg/dL — ABNORMAL HIGH (ref 65–99)
POTASSIUM: 4.1 mmol/L (ref 3.5–5.1)
Sodium: 141 mmol/L (ref 135–145)
TOTAL PROTEIN: 8.5 g/dL — AB (ref 6.5–8.1)

## 2017-08-14 LAB — CBC WITH DIFFERENTIAL/PLATELET
BASOS ABS: 0 10*3/uL (ref 0.0–0.1)
BASOS PCT: 0 %
EOS ABS: 0 10*3/uL (ref 0.0–0.7)
Eosinophils Relative: 0 %
HEMATOCRIT: 46.1 % (ref 39.0–52.0)
Hemoglobin: 15.1 g/dL (ref 13.0–17.0)
Lymphocytes Relative: 10 %
Lymphs Abs: 0.9 10*3/uL (ref 0.7–4.0)
MCH: 23.5 pg — ABNORMAL LOW (ref 26.0–34.0)
MCHC: 32.8 g/dL (ref 30.0–36.0)
MCV: 71.7 fL — ABNORMAL LOW (ref 78.0–100.0)
MONO ABS: 0.4 10*3/uL (ref 0.1–1.0)
Monocytes Relative: 5 %
NEUTROS ABS: 8 10*3/uL — AB (ref 1.7–7.7)
NEUTROS PCT: 86 %
Platelets: 135 10*3/uL — ABNORMAL LOW (ref 150–400)
RBC: 6.43 MIL/uL — ABNORMAL HIGH (ref 4.22–5.81)
RDW: 15.4 % (ref 11.5–15.5)
WBC: 9.3 10*3/uL (ref 4.0–10.5)

## 2017-08-14 LAB — I-STAT CG4 LACTIC ACID, ED
LACTIC ACID, VENOUS: 1.96 mmol/L — AB (ref 0.5–1.9)
LACTIC ACID, VENOUS: 1.35 mmol/L (ref 0.5–1.9)

## 2017-08-14 LAB — LIPASE, BLOOD: LIPASE: 22 U/L (ref 11–51)

## 2017-08-14 MED ORDER — ACETAMINOPHEN 325 MG PO TABS: 650.0000 mg | ORAL_TABLET | Freq: Four times a day (QID) | ORAL | Status: DC | PRN

## 2017-08-14 MED ORDER — ONDANSETRON HCL 4 MG PO TABS: 4.0000 mg | ORAL_TABLET | Freq: Four times a day (QID) | ORAL | Status: DC | PRN

## 2017-08-14 MED ORDER — ONDANSETRON HCL 4 MG/2ML IJ SOLN
4.0000 mg | Freq: Four times a day (QID) | INTRAMUSCULAR | Status: DC | PRN
  Administered 2017-08-15 (×2): 4 mg via INTRAVENOUS
  Filled 2017-08-14 (×2): qty 2

## 2017-08-14 MED ORDER — ACETAMINOPHEN 650 MG RE SUPP: 650.0000 mg | Freq: Four times a day (QID) | RECTAL | Status: DC | PRN

## 2017-08-14 MED ORDER — ACETAMINOPHEN 325 MG PO TABS
650.0000 mg | ORAL_TABLET | Freq: Once | ORAL | Status: AC
  Administered 2017-08-14: 650 mg via ORAL
  Filled 2017-08-14: qty 2

## 2017-08-14 MED ORDER — RIVAROXABAN 20 MG PO TABS
20.0000 mg | ORAL_TABLET | Freq: Every day | ORAL | Status: DC
  Administered 2017-08-15 – 2017-08-16 (×3): 20 mg via ORAL
  Filled 2017-08-14 (×4): qty 1

## 2017-08-14 MED ORDER — SODIUM CHLORIDE 0.9 % IV SOLN
INTRAVENOUS | Status: DC
  Administered 2017-08-15: 01:00:00 via INTRAVENOUS
  Administered 2017-08-16: 1000 mL via INTRAVENOUS

## 2017-08-14 MED ORDER — SODIUM CHLORIDE 0.9 % IV BOLUS (SEPSIS)
1000.0000 mL | Freq: Once | INTRAVENOUS | Status: AC
  Administered 2017-08-14: 1000 mL via INTRAVENOUS

## 2017-08-14 MED ORDER — SENNOSIDES-DOCUSATE SODIUM 8.6-50 MG PO TABS: 1.0000 | ORAL_TABLET | Freq: Every evening | ORAL | Status: DC | PRN

## 2017-08-14 MED ORDER — DEXTROSE 5 % IV SOLN
1.0000 g | Freq: Once | INTRAVENOUS | Status: AC
  Administered 2017-08-14: 1 g via INTRAVENOUS
  Filled 2017-08-14: qty 10

## 2017-08-14 MED ORDER — OXYCODONE HCL 5 MG PO TABS
15.0000 mg | ORAL_TABLET | Freq: Four times a day (QID) | ORAL | Status: DC | PRN
  Administered 2017-08-15 – 2017-08-16 (×3): 15 mg via ORAL
  Filled 2017-08-14 (×3): qty 3

## 2017-08-14 MED ORDER — ONDANSETRON HCL 4 MG/2ML IJ SOLN
4.0000 mg | INTRAMUSCULAR | Status: AC
  Administered 2017-08-14: 4 mg via INTRAVENOUS
  Filled 2017-08-14: qty 2
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
## 2017-08-15 ENCOUNTER — Encounter (HOSPITAL_COMMUNITY): Payer: Self-pay

## 2017-08-15 ENCOUNTER — Other Ambulatory Visit: Payer: Self-pay

## 2017-08-15 ENCOUNTER — Inpatient Hospital Stay (HOSPITAL_COMMUNITY): Payer: Medicaid Other

## 2017-08-15 DIAGNOSIS — R6520 Severe sepsis without septic shock: Secondary | ICD-10-CM | POA: Diagnosis present

## 2017-08-15 DIAGNOSIS — I10 Essential (primary) hypertension: Secondary | ICD-10-CM | POA: Diagnosis present

## 2017-08-15 DIAGNOSIS — G8921 Chronic pain due to trauma: Secondary | ICD-10-CM

## 2017-08-15 DIAGNOSIS — Z87440 Personal history of urinary (tract) infections: Secondary | ICD-10-CM | POA: Diagnosis not present

## 2017-08-15 DIAGNOSIS — G8220 Paraplegia, unspecified: Secondary | ICD-10-CM | POA: Diagnosis present

## 2017-08-15 DIAGNOSIS — Z960 Presence of urogenital implants: Secondary | ICD-10-CM

## 2017-08-15 DIAGNOSIS — R079 Chest pain, unspecified: Secondary | ICD-10-CM

## 2017-08-15 DIAGNOSIS — N1330 Unspecified hydronephrosis: Secondary | ICD-10-CM

## 2017-08-15 DIAGNOSIS — M546 Pain in thoracic spine: Secondary | ICD-10-CM | POA: Diagnosis not present

## 2017-08-15 DIAGNOSIS — N390 Urinary tract infection, site not specified: Secondary | ICD-10-CM

## 2017-08-15 DIAGNOSIS — Z6841 Body Mass Index (BMI) 40.0 and over, adult: Secondary | ICD-10-CM | POA: Diagnosis not present

## 2017-08-15 DIAGNOSIS — Z933 Colostomy status: Secondary | ICD-10-CM

## 2017-08-15 DIAGNOSIS — N179 Acute kidney failure, unspecified: Secondary | ICD-10-CM | POA: Diagnosis present

## 2017-08-15 DIAGNOSIS — A419 Sepsis, unspecified organism: Secondary | ICD-10-CM | POA: Diagnosis present

## 2017-08-15 DIAGNOSIS — R112 Nausea with vomiting, unspecified: Secondary | ICD-10-CM | POA: Diagnosis present

## 2017-08-15 DIAGNOSIS — B964 Proteus (mirabilis) (morganii) as the cause of diseases classified elsewhere: Secondary | ICD-10-CM | POA: Diagnosis present

## 2017-08-15 DIAGNOSIS — N319 Neuromuscular dysfunction of bladder, unspecified: Secondary | ICD-10-CM | POA: Diagnosis present

## 2017-08-15 DIAGNOSIS — N10 Acute tubulo-interstitial nephritis: Secondary | ICD-10-CM | POA: Diagnosis not present

## 2017-08-15 DIAGNOSIS — N3000 Acute cystitis without hematuria: Secondary | ICD-10-CM | POA: Diagnosis not present

## 2017-08-15 DIAGNOSIS — Z7901 Long term (current) use of anticoagulants: Secondary | ICD-10-CM | POA: Diagnosis not present

## 2017-08-15 DIAGNOSIS — G8929 Other chronic pain: Secondary | ICD-10-CM | POA: Diagnosis present

## 2017-08-15 DIAGNOSIS — Z79891 Long term (current) use of opiate analgesic: Secondary | ICD-10-CM

## 2017-08-15 DIAGNOSIS — Z86718 Personal history of other venous thrombosis and embolism: Secondary | ICD-10-CM

## 2017-08-15 DIAGNOSIS — Z8249 Family history of ischemic heart disease and other diseases of the circulatory system: Secondary | ICD-10-CM | POA: Diagnosis not present

## 2017-08-15 DIAGNOSIS — T83518A Infection and inflammatory reaction due to other urinary catheter, initial encounter: Secondary | ICD-10-CM | POA: Diagnosis not present

## 2017-08-15 DIAGNOSIS — E669 Obesity, unspecified: Secondary | ICD-10-CM | POA: Diagnosis present

## 2017-08-15 DIAGNOSIS — Y846 Urinary catheterization as the cause of abnormal reaction of the patient, or of later complication, without mention of misadventure at the time of the procedure: Secondary | ICD-10-CM | POA: Diagnosis present

## 2017-08-15 DIAGNOSIS — N136 Pyonephrosis: Secondary | ICD-10-CM | POA: Diagnosis present

## 2017-08-15 LAB — BASIC METABOLIC PANEL
Anion gap: 9 (ref 5–15)
BUN: 22 mg/dL — AB (ref 6–20)
CHLORIDE: 107 mmol/L (ref 101–111)
CO2: 24 mmol/L (ref 22–32)
CREATININE: 1.48 mg/dL — AB (ref 0.61–1.24)
Calcium: 9 mg/dL (ref 8.9–10.3)
GFR, EST NON AFRICAN AMERICAN: 56 mL/min — AB (ref >60)
Glucose, Bld: 124 mg/dL — ABNORMAL HIGH (ref 65–99)
Potassium: 4.1 mmol/L (ref 3.5–5.1)
SODIUM: 140 mmol/L (ref 135–145)

## 2017-08-15 LAB — CBC
HEMATOCRIT: 42.2 % (ref 39.0–52.0)
HEMOGLOBIN: 13.5 g/dL (ref 13.0–17.0)
MCH: 23.3 pg — ABNORMAL LOW (ref 26.0–34.0)
MCHC: 32 g/dL (ref 30.0–36.0)
MCV: 72.9 fL — ABNORMAL LOW (ref 78.0–100.0)
Platelets: 171 10*3/uL (ref 150–400)
RBC: 5.79 MIL/uL (ref 4.22–5.81)
RDW: 15.8 % — ABNORMAL HIGH (ref 11.5–15.5)
WBC: 6.6 10*3/uL (ref 4.0–10.5)

## 2017-08-15 LAB — GLUCOSE, CAPILLARY: Glucose-Capillary: 143 mg/dL — ABNORMAL HIGH (ref 65–99)

## 2017-08-15 MED ORDER — DEXTROSE 5 % IV SOLN
1.0000 g | INTRAVENOUS | Status: DC
  Administered 2017-08-15 – 2017-08-17 (×3): 1 g via INTRAVENOUS
  Filled 2017-08-15 (×3): qty 10
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
## 2017-08-15 NOTE — Progress Notes (Signed)
CT Scan was postponed twice; first time he was waiting until the suprapubic catheter was changed and later he wanted to wait until he finished eating.

## 2017-08-15 NOTE — Progress Notes (Signed)
Date: 08/15/2017  Patient name: Charles Poole Boardman  Medical record number: [id number]  Date of birth: 1971-01-28   I have seen and evaluated Charles Poole Charles Poole and discussed their care with the Residency Team. Mr Charles Poole is a 46 yo man with complaint he is a paraplegia and neurogenic bladder. He has had a suprapubic catheter for 4 years with resultant recurrent UTIs. His [facility]rolog[facility] is with [facility] and he was last seen in January 2018. At that time they discussed strategies to deal with the recurrent UTIs but no changes were made during that visit.. In August 2018, he was hospitalized for a UTI and the culture grew ESBL Klebsiella and highly resistant Escherichia coli. He was treated with 4 days of IV meropenem and not treated with further days as an outpatient after consultation with ID. He was admitted in October 2018 for a another UTI which grew Proteus. He was treated with 4 days of meropenem and discharge to finish 3 days of amoxicillin as the Proteus was sensitive to amoxicillin.  He came to the ED via EMS with chief complaint of chills, diaphoresis, nausea, and vomiting. Associated symptoms included nausea, anorexia, vomiting, urinary leakage around his suprapubic catheter. He states he has had both bladder infections and kidney infections and his symptoms do not differ between the 2. Today, he states he feels better but is still concerned that his suprapubic catheter is leaking and would like it changed.   Vitals:   08/15/17 0151 08/15/17 0543  BP: 114/80 99/67  Pulse: (!) 106 (!) 106  Resp: 19 18  Temp: 98.8 F (37.1 C) 98.2 F (36.8 C)  SpO2: 97% 99%  Gen sitting up in bed, NAD HRRR no MRG LCTAB good air flow ABD + BS, soft, suprapubic catheter site mild dried blood, leaking urine, no pus Skin, no yeast in skin folds, no rash  Cr 1.63 - 1.48 Cr baseline 0.9 WBC 9.3 HgB 11.5 Plts 127 - 171 UA 0-5 sq ep, 6-30 WBC, lg LE, neg nitrates, 0-5 RBC  Renal U/S mild B  hydronephrosis  Assessment and Plan: I have seen and evaluated the patient as outlined above. I agree with the formulated Assessment and Plan as detailed in the residents' note, with the following changes: Mr. Charles Poole is a 46 year old man with T2 paraplegia, neurogenic bladder, suprapubic catheter, and recurrent UTIs who presents with signs, symptoms, and test results consistent with a urinary tract infection. Due to his fever, nausea, and vomiting it is likely that he has pyelonephritis rather than a bladder infection. It is unclear whether this is a recurrent UTI or a relapse. Due to the bilateral hydronephrosis, we will obtain a CT scan to rule out renal stones as a cause of hydronephrosis. Additionally, this may suggest changes consistent with pyelonephritis or show stones that could be infected thereby leading to frequent UTIs. He will be maintained on ceftriaxone until urine cultures can have this to narrow his antibiotics.  1. Severe sepsis - tx underlying cause (#2)  2. Acute complicated UTI / pyelonephritis - we will continue ceftriaxone and follow his urine culture results. Hopefully, we will be able to narrow his antibiotics. We are obtaining a CT scan to assess for hydronephrosis etiology, pyelonephritis changes, or stone presents. We will get stat to change out his suprapubic Foley catheter. He will need to follow-up with his outpatient urologist to start discussing whether interventions to prevent recurrent UTIs are indicated.  3. AKI - the etiology of his AK I is likely prerenal and  he is receiving IV fluids. Additionally, we are obtaining a CT scan to further characterize his bilateral mild hydronephrosis which could be a cause for postrenal AK I. His creatinine has responded slightly IV fluids but is still above baseline.  I anticipate discharge once the culture results have returned and we can narrow antibiotics. This will likely be on the 9th or 10th.  Burns SpainButcher, Willis Kuipers A,  MD 11/8/201812:27 PM

## 2017-08-16 DIAGNOSIS — N179 Acute kidney failure, unspecified: Secondary | ICD-10-CM | POA: Diagnosis not present

## 2017-08-16 DIAGNOSIS — N3000 Acute cystitis without hematuria: Secondary | ICD-10-CM | POA: Diagnosis not present

## 2017-08-16 LAB — BASIC METABOLIC PANEL
ANION GAP: 7 (ref 5–15)
BUN: 21 mg/dL — ABNORMAL HIGH (ref 6–20)
CO2: 25 mmol/L (ref 22–32)
Calcium: 8.7 mg/dL — ABNORMAL LOW (ref 8.9–10.3)
Chloride: 104 mmol/L (ref 101–111)
Creatinine, Ser: 1.32 mg/dL — ABNORMAL HIGH (ref 0.61–1.24)
GFR calc Af Amer: >60 mL/min (ref >60)
Glucose, Bld: 137 mg/dL — ABNORMAL HIGH (ref 65–99)
POTASSIUM: 3.3 mmol/L — AB (ref 3.5–5.1)
Sodium: 136 mmol/L (ref 135–145)

## 2017-08-16 LAB — GLUCOSE, CAPILLARY: GLUCOSE-CAPILLARY: 115 mg/dL — AB (ref 65–99)

## 2017-08-16 MED ORDER — POTASSIUM CHLORIDE CRYS ER 20 MEQ PO TBCR
40.0000 meq | EXTENDED_RELEASE_TABLET | Freq: Once | ORAL | Status: AC
  Administered 2017-08-16: 40 meq via ORAL
  Filled 2017-08-16: qty 2
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
## 2017-08-17 ENCOUNTER — Encounter (HOSPITAL_COMMUNITY): Payer: Self-pay | Admitting: *Deleted

## 2017-08-17 DIAGNOSIS — N179 Acute kidney failure, unspecified: Secondary | ICD-10-CM

## 2017-08-17 DIAGNOSIS — N3000 Acute cystitis without hematuria: Secondary | ICD-10-CM | POA: Diagnosis not present

## 2017-08-17 LAB — BASIC METABOLIC PANEL
Anion gap: 4 — ABNORMAL LOW (ref 5–15)
BUN: 14 mg/dL (ref 6–20)
CHLORIDE: 104 mmol/L (ref 101–111)
CO2: 28 mmol/L (ref 22–32)
Calcium: 8.7 mg/dL — ABNORMAL LOW (ref 8.9–10.3)
Creatinine, Ser: 1.02 mg/dL (ref 0.61–1.24)
GFR calc Af Amer: >60 mL/min (ref >60)
GFR calc non Af Amer: >60 mL/min (ref >60)
GLUCOSE: 118 mg/dL — AB (ref 65–99)
POTASSIUM: 3.8 mmol/L (ref 3.5–5.1)
SODIUM: 136 mmol/L (ref 135–145)

## 2017-08-17 LAB — URINE CULTURE: Culture: >=100000 — AB

## 2017-08-17 MED ORDER — AMOXICILLIN-POT CLAVULANATE 875-125 MG PO TABS: 1.0000 | ORAL_TABLET | Freq: Two times a day (BID) | ORAL | 0 refills | Status: AC

## 2017-08-17 NOTE — Progress Notes (Signed)
Nsg Discharge Note  Admit Date:  08/14/2017 Discharge date: 08/17/2017   Charles Poole to be D/C'd Home per MD order.  AVS completed.  Copy for chart, and copy for patient signed, and dated. Patient/caregiver able to verbalize understanding.  Discharge Medication: Allergies as of 08/17/2017   No Known Allergies     Medication List    STOP taking these medications   amoxicillin 500 MG capsule Commonly known as:  AMOXIL     TAKE these medications   amoxicillin-clavulanate 875-125 MG tablet Commonly known as:  AUGMENTIN Take 1 tablet 2 (two) times daily for 10 days by mouth. starting tomorrow (11/11)   ferrous sulfate 325 (65 FE) MG tablet Commonly known as:  FERROUSUL Take 1 tablet (325 mg total) by mouth 3 (three) times daily with meals.   oxyCODONE 15 MG immediate release tablet Commonly known as:  ROXICODONE Take 15 mg by mouth every 6 (six) hours as needed for pain.   rivaroxaban 20 MG Tabs tablet Commonly known as:  XARELTO Take 20 mg by mouth daily with supper.       Discharge Assessment: Vitals:   08/16/17 2106 08/17/17 0541  BP: 115/77 125/73  Pulse: 94 74  Resp: 20 17  Temp: 99.3 F (37.4 C) 98.4 F (36.9 C)  SpO2: 99% 98%   Skin clean, dry and intact without evidence of skin break down, no evidence of skin tears noted. IV catheter discontinued intact. Site without signs and symptoms of complications - no redness or edema noted at insertion site, patient denies c/o pain - only slight tenderness at site.  Dressing with slight pressure applied.  D/c Instructions-Education: Discharge instructions given to patient/family with verbalized understanding. D/c education completed with patient/family including follow up instructions, medication list, d/c activities limitations if indicated, with other d/c instructions as indicated by MD - patient able to verbalize understanding, all questions fully answered. Patient instructed to return to ED, call 911, or call  MD for any changes in condition.  Patient will be escorted via WC, and D/C home via private auto with brother  Charles FlamingVicki L Ki Luckman, RN 08/17/2017 3:52 PM
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
## 2017-08-17 NOTE — Discharge Summary (Signed)
Name: Charles Poole MRN: [id number] DOB: 1971-02-18 46 y.o. PCP: Gregor Hams, FNP  Date of Admission: 08/14/2017  7:37 PM Date of Discharge: 08/17/2017 Attending Physician: Bartholomew Crews, MD   Discharge Diagnosis: 1. Complicated UTI   Active Problems:   UTI (urinary tract infection)   Discharge Medications: Allergies as of 08/17/2017   No Known Allergies     Medication List    STOP taking these medications   amoxicillin 500 MG capsule Commonly known as:  AMOXIL     TAKE these medications   amoxicillin-clavulanate 875-125 MG tablet Commonly known as:  AUGMENTIN Take 1 tablet 2 (two) times daily for 10 days by mouth. starting tomorrow (11/11)   ferrous sulfate 325 (65 FE) MG tablet Commonly known as:  FERROUSUL Take 1 tablet (325 mg total) by mouth 3 (three) times daily with meals.   oxyCODONE 15 MG immediate release tablet Commonly known as:  ROXICODONE Take 15 mg by mouth every 6 (six) hours as needed for pain.   rivaroxaban 20 MG Tabs tablet Commonly known as:  XARELTO Take 20 mg by mouth daily with supper.       Disposition and follow-up:   Mr.Charles Poole was [facility] from [facility] in Stable condition.  At the hospital follow up visit please address:  1.  Please assess for ongoing symptoms and completion of antibiotic therapy (augmentin BID x 10 days). Please discuss with patient if candidate for antibiotic prophylaxis given multiple UTIs in < 12 months.   2.  Labs / imaging needed at time of follow-up: None   3.  Pending labs/ test needing follow-up: None  Follow-up Appointments: Follow-up Information    [facility]. Schedule an appointment as soon as possible for a visit.   Specialty:  Urology Why:  Please call your urologist to make a follow-up appointment          Hospital Course by problem list:  Charles Poole is a 46 yo with a PMH of HTN, complete T2 paraplegia complicated by  neurogenic bladder with chronic,indwellingsuprapubic catheterand recurrent UTI'swho presented with signs and symptoms of sepsis 2/2 UTI.   1. Complicated UTI: Patient presented with nausea, vomiting and chills of 1 day duration. Upon admission he met sepsis criteria given tachycardia, hypotension, evidence of UTI on UA and AKI, and he was started on IV CTX. Of note, he was recently admitted on 10/13 for Proteus mirabilis UTI and was treated with amoxicillin. CT renal showed bilateral nephrosis without evidence of nephrolithiasis or pyelonephritis. Unclear if this is new or chronic. Urine output remained good during admission and AKI resolved with IV hydration. Ucx grew Proteus mirabilis mostly pansensitive (R-nitrofurantoin) and he was switched to Augmentin. He was discharged on Augmentin 875 mg BID x 10 days. Suprapubic catheter exchanged 11/8 due to urine leakage. He was advised to follow up with urology. Will need discussion of prophylactic antibiotic therapy given 6 UTI admissions this year (3 in the past 3 months).   He was continued on all his home medications during this admission.   Discharge Vitals:   BP 125/73 (BP Location: Right Arm)   Pulse 74   Temp 98.4 F (36.9 C) (Oral)   Resp 17   Ht 6' 1"  (1.854 m)   Wt (!) 317 lb 3.2 oz (143.9 kg)   SpO2 98%   BMI 41.85 kg/m   Pertinent Labs, Studies, and Procedures:   CT renal: IMPRESSION 1. Mild bilateral hydronephrosis and hydroureter, negative  for obstructing ureteral stone. Suprapubic catheter in the bladder which is empty but appears thick-walled. Generalized pelvic inflammatory process, mostly around the bladder, possibly representing cystitis. 2. Left lower quadrant colostomy. No evidence for bowel obstruction. 3. High density sludge and/or stones within the gallbladder  Ucx:  Specimen Description URINE, RANDOM   Special Requests NONE   Culture >=100,000 COLONIES/mL PROTEUS MIRABILIS Abnormal    Report Status 08/17/2017  FINAL   Organism ID, Bacteria PROTEUS MIRABILIS Abnormal    Resulting Agency [facility]  Susceptibility    Proteus mirabilis    MIC    AMPICILLIN <=2 SENSITIVE  Sensitive    AMPICILLIN/SULBACTAM <=2 SENSITIVE  Sensitive    CEFAZOLIN <=4 SENSITIVE  Sensitive    CEFTRIAXONE <=1 SENSITIVE  Sensitive    CIPROFLOXACIN <=0.25 SENS... Sensitive    GENTAMICIN <=1 SENSITIVE  Sensitive    IMIPENEM 2 SENSITIVE  Sensitive    NITROFURANTOIN 128 RESISTANT  Resistant    PIP/TAZO <=4 SENSITIVE  Sensitive    TRIMETH/SULFA <=20 SENSIT... Sensitive        Discharge Instructions: Discharge Instructions    Call MD for:  persistant nausea and vomiting   Complete by:  As directed    Call MD for:  redness, tenderness, or signs of infection (pain, swelling, redness, odor or green/yellow discharge around incision site)   Complete by:  As directed    Call MD for:  temperature >100.4   Complete by:  As directed    Diet general   Complete by:  As directed    Increase activity slowly   Complete by:  As directed       Signed: Welford Roche, MD  Internal Medicine PGY-1  P [phone]

## 2017-08-17 NOTE — Discharge Instructions (Addendum)
[facility] for [facility].  You were treated with IV antibiotics.   Please start taking Augmentin 875 mg 1 tablet 2 times day for 10 days starting tomorrow (11/11).  This is a stronger antibiotic than amoxicillin.  Please make a follow-up appointment with your urologist.  You will need to discuss with him need for permanent antibiotic therapy as prophylaxis for urinary tract infections.

## 2017-11-10 ENCOUNTER — Encounter (HOSPITAL_COMMUNITY): Payer: Self-pay | Admitting: *Deleted

## 2017-11-10 ENCOUNTER — Inpatient Hospital Stay (HOSPITAL_COMMUNITY)
Admission: EM | Admit: 2017-11-10 | Discharge: 2017-11-13 | DRG: 699 | Disposition: A | Discharge status: Home / Self Care | Payer: Medicaid Other | Attending: Internal Medicine | Admitting: Internal Medicine

## 2017-11-10 DIAGNOSIS — Z86718 Personal history of other venous thrombosis and embolism: Secondary | ICD-10-CM | POA: Diagnosis not present

## 2017-11-10 DIAGNOSIS — L909 Atrophic disorder of skin, unspecified: Secondary | ICD-10-CM

## 2017-11-10 DIAGNOSIS — R238 Other skin changes: Secondary | ICD-10-CM | POA: Diagnosis present

## 2017-11-10 DIAGNOSIS — G8220 Paraplegia, unspecified: Secondary | ICD-10-CM | POA: Diagnosis present

## 2017-11-10 DIAGNOSIS — T83511A Infection and inflammatory reaction due to indwelling urethral catheter, initial encounter: Principal | ICD-10-CM | POA: Diagnosis present

## 2017-11-10 DIAGNOSIS — L03115 Cellulitis of right lower limb: Secondary | ICD-10-CM | POA: Diagnosis present

## 2017-11-10 DIAGNOSIS — Z8249 Family history of ischemic heart disease and other diseases of the circulatory system: Secondary | ICD-10-CM

## 2017-11-10 DIAGNOSIS — K5900 Constipation, unspecified: Secondary | ICD-10-CM | POA: Diagnosis present

## 2017-11-10 DIAGNOSIS — T83510D Infection and inflammatory reaction due to cystostomy catheter, subsequent encounter: Secondary | ICD-10-CM | POA: Diagnosis not present

## 2017-11-10 DIAGNOSIS — G8929 Other chronic pain: Secondary | ICD-10-CM | POA: Diagnosis present

## 2017-11-10 DIAGNOSIS — B964 Proteus (mirabilis) (morganii) as the cause of diseases classified elsewhere: Secondary | ICD-10-CM | POA: Diagnosis present

## 2017-11-10 DIAGNOSIS — Z87440 Personal history of urinary (tract) infections: Secondary | ICD-10-CM

## 2017-11-10 DIAGNOSIS — N319 Neuromuscular dysfunction of bladder, unspecified: Secondary | ICD-10-CM | POA: Diagnosis present

## 2017-11-10 DIAGNOSIS — W3400XS Accidental discharge from unspecified firearms or gun, sequela: Secondary | ICD-10-CM

## 2017-11-10 DIAGNOSIS — N390 Urinary tract infection, site not specified: Secondary | ICD-10-CM | POA: Diagnosis not present

## 2017-11-10 DIAGNOSIS — Z7901 Long term (current) use of anticoagulants: Secondary | ICD-10-CM

## 2017-11-10 DIAGNOSIS — L97519 Non-pressure chronic ulcer of other part of right foot with unspecified severity: Secondary | ICD-10-CM | POA: Diagnosis present

## 2017-11-10 DIAGNOSIS — T24021S Burn of unspecified degree of right knee, sequela: Secondary | ICD-10-CM

## 2017-11-10 DIAGNOSIS — Z933 Colostomy status: Secondary | ICD-10-CM

## 2017-11-10 DIAGNOSIS — X16XXXS Contact with hot heating appliances, radiators and pipes, sequela: Secondary | ICD-10-CM | POA: Diagnosis present

## 2017-11-10 DIAGNOSIS — D696 Thrombocytopenia, unspecified: Secondary | ICD-10-CM | POA: Diagnosis present

## 2017-11-10 DIAGNOSIS — T310 Burns involving less than 10% of body surface: Secondary | ICD-10-CM | POA: Diagnosis present

## 2017-11-10 DIAGNOSIS — Z9359 Other cystostomy status: Secondary | ICD-10-CM

## 2017-11-10 DIAGNOSIS — D509 Iron deficiency anemia, unspecified: Secondary | ICD-10-CM | POA: Diagnosis present

## 2017-11-10 DIAGNOSIS — Z8619 Personal history of other infectious and parasitic diseases: Secondary | ICD-10-CM | POA: Diagnosis present

## 2017-11-10 DIAGNOSIS — S24102S Unspecified injury at T2-T6 level of thoracic spinal cord, sequela: Secondary | ICD-10-CM

## 2017-11-10 DIAGNOSIS — Y846 Urinary catheterization as the cause of abnormal reaction of the patient, or of later complication, without mention of misadventure at the time of the procedure: Secondary | ICD-10-CM | POA: Diagnosis present

## 2017-11-10 DIAGNOSIS — I10 Essential (primary) hypertension: Secondary | ICD-10-CM | POA: Diagnosis present

## 2017-11-10 HISTORY — DX: Acute embolism and thrombosis of unspecified deep veins of unspecified lower extremity: I82.409

## 2017-11-10 LAB — BASIC METABOLIC PANEL
ANION GAP: 11 (ref 5–15)
BUN: 16 mg/dL (ref 6–20)
CHLORIDE: 98 mmol/L — AB (ref 101–111)
CO2: 29 mmol/L (ref 22–32)
CREATININE: 1.01 mg/dL (ref 0.61–1.24)
Calcium: 9.3 mg/dL (ref 8.9–10.3)
GFR calc non Af Amer: >60 mL/min (ref >60)
Glucose, Bld: 132 mg/dL — ABNORMAL HIGH (ref 65–99)
POTASSIUM: 3.7 mmol/L (ref 3.5–5.1)
SODIUM: 138 mmol/L (ref 135–145)

## 2017-11-10 LAB — CBC WITH DIFFERENTIAL/PLATELET
BASOS ABS: 0 10*3/uL (ref 0.0–0.1)
BASOS PCT: 0 %
EOS ABS: 0 10*3/uL (ref 0.0–0.7)
Eosinophils Relative: 0 %
HEMATOCRIT: 35.4 % — AB (ref 39.0–52.0)
HEMOGLOBIN: 11.1 g/dL — AB (ref 13.0–17.0)
Lymphocytes Relative: 26 %
Lymphs Abs: 1.2 10*3/uL (ref 0.7–4.0)
MCH: 23.7 pg — ABNORMAL LOW (ref 26.0–34.0)
MCHC: 31.4 g/dL (ref 30.0–36.0)
MCV: 75.6 fL — ABNORMAL LOW (ref 78.0–100.0)
MONOS PCT: 9 %
Monocytes Absolute: 0.4 10*3/uL (ref 0.1–1.0)
NEUTROS ABS: 2.9 10*3/uL (ref 1.7–7.7)
NEUTROS PCT: 65 %
Platelets: 96 10*3/uL — ABNORMAL LOW (ref 150–400)
RBC: 4.68 MIL/uL (ref 4.22–5.81)
RDW: 14.5 % (ref 11.5–15.5)
WBC: 4.5 10*3/uL (ref 4.0–10.5)

## 2017-11-10 LAB — URINALYSIS, ROUTINE W REFLEX MICROSCOPIC
BILIRUBIN URINE: NEGATIVE
GLUCOSE, UA: 50 mg/dL — AB
Ketones, ur: 5 mg/dL — AB
NITRITE: POSITIVE — AB
PROTEIN: NEGATIVE mg/dL
Specific Gravity, Urine: 1.027 (ref 1.005–1.030)
pH: 6 (ref 5.0–8.0)

## 2017-11-10 LAB — LACTIC ACID, PLASMA: Lactic Acid, Venous: 1.6 mmol/L (ref 0.5–1.9)

## 2017-11-10 MED ORDER — RIVAROXABAN 20 MG PO TABS
20.0000 mg | ORAL_TABLET | Freq: Every day | ORAL | Status: DC
  Administered 2017-11-11 – 2017-11-13 (×3): 20 mg via ORAL
  Filled 2017-11-10 (×3): qty 1

## 2017-11-10 MED ORDER — SILVER SULFADIAZINE 1 % EX CREA: 1.0000 "application " | TOPICAL_CREAM | Freq: Every day | CUTANEOUS | Status: DC | PRN

## 2017-11-10 MED ORDER — ACETAMINOPHEN 325 MG PO TABS: 650.0000 mg | ORAL_TABLET | Freq: Four times a day (QID) | ORAL | Status: DC | PRN

## 2017-11-10 MED ORDER — NYSTATIN 100000 UNIT/GM EX POWD
Freq: Two times a day (BID) | CUTANEOUS | Status: DC
  Administered 2017-11-11 – 2017-11-12 (×4): via TOPICAL
  Filled 2017-11-10 (×2): qty 15

## 2017-11-10 MED ORDER — BISACODYL 5 MG PO TBEC: 5.0000 mg | DELAYED_RELEASE_TABLET | Freq: Every day | ORAL | Status: DC | PRN

## 2017-11-10 MED ORDER — ONDANSETRON HCL 4 MG PO TABS: 4.0000 mg | ORAL_TABLET | Freq: Four times a day (QID) | ORAL | Status: DC | PRN

## 2017-11-10 MED ORDER — SODIUM CHLORIDE 0.9 % IV BOLUS (SEPSIS)
1000.0000 mL | Freq: Once | INTRAVENOUS | Status: AC
  Administered 2017-11-10: 1000 mL via INTRAVENOUS

## 2017-11-10 MED ORDER — DEXTROSE 5 % IV SOLN
1.0000 g | INTRAVENOUS | Status: DC
  Administered 2017-11-11 – 2017-11-13 (×3): 1 g via INTRAVENOUS
  Filled 2017-11-10 (×3): qty 10

## 2017-11-10 MED ORDER — SODIUM CHLORIDE 0.9% FLUSH
3.0000 mL | Freq: Two times a day (BID) | INTRAVENOUS | Status: DC
  Administered 2017-11-11 – 2017-11-13 (×3): 3 mL via INTRAVENOUS

## 2017-11-10 MED ORDER — DEXTROSE 5 % IV SOLN: 1.0000 g | Freq: Once | INTRAVENOUS | Status: DC

## 2017-11-10 MED ORDER — ONDANSETRON HCL 4 MG/2ML IJ SOLN
4.0000 mg | Freq: Four times a day (QID) | INTRAMUSCULAR | Status: DC | PRN
  Administered 2017-11-13: 4 mg via INTRAVENOUS
  Filled 2017-11-10: qty 2

## 2017-11-10 MED ORDER — HYDROCODONE-ACETAMINOPHEN 5-325 MG PO TABS: 1.0000 | ORAL_TABLET | ORAL | Status: DC | PRN

## 2017-11-10 MED ORDER — SODIUM CHLORIDE 0.9 % IV SOLN: 250.0000 mL | INTRAVENOUS | Status: DC | PRN

## 2017-11-10 MED ORDER — OXYCODONE HCL 5 MG PO TABS
15.0000 mg | ORAL_TABLET | Freq: Four times a day (QID) | ORAL | Status: DC | PRN
  Administered 2017-11-12 (×2): 15 mg via ORAL
  Filled 2017-11-10 (×2): qty 3

## 2017-11-10 MED ORDER — SODIUM CHLORIDE 0.9 % IV BOLUS (SEPSIS)
1000.0000 mL | Freq: Once | INTRAVENOUS | Status: AC
Start: 2017-11-10 — End: 2017-11-10
  Administered 2017-11-10: 1000 mL via INTRAVENOUS

## 2017-11-10 MED ORDER — SODIUM CHLORIDE 0.9% FLUSH
3.0000 mL | INTRAVENOUS | Status: DC | PRN
  Administered 2017-11-11: 3 mL via INTRAVENOUS
  Filled 2017-11-10: qty 3

## 2017-11-10 MED ORDER — PIPERACILLIN-TAZOBACTAM 3.375 G IVPB 30 MIN
3.3750 g | Freq: Once | INTRAVENOUS | Status: AC
  Administered 2017-11-10: 3.375 g via INTRAVENOUS
  Filled 2017-11-10: qty 50

## 2017-11-10 MED ORDER — SENNOSIDES-DOCUSATE SODIUM 8.6-50 MG PO TABS: 1.0000 | ORAL_TABLET | Freq: Every evening | ORAL | Status: DC | PRN

## 2017-11-10 MED ORDER — VANCOMYCIN HCL IN DEXTROSE 1-5 GM/200ML-% IV SOLN
1000.0000 mg | Freq: Once | INTRAVENOUS | Status: AC
  Administered 2017-11-10: 1000 mg via INTRAVENOUS
  Filled 2017-11-10: qty 200

## 2017-11-10 MED ORDER — ACETAMINOPHEN 650 MG RE SUPP: 650.0000 mg | Freq: Four times a day (QID) | RECTAL | Status: DC | PRN
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
## 2017-11-10 NOTE — Progress Notes (Signed)
Pharmacy Antibiotic Note  Charles GarfinkelKenneth Poole is a 47 y.o. male admitted on 11/10/2017 with UTI.  Pharmacy has been consulted for Ceftriaxone dosing. Hx UTI. WBC WNL.   Plan: Ceftriaxone 1g IV q24h F/U urine culture for directed therapy  Temp (24hrs), Avg:99.1 F (37.3 C), Min:98.8 F (37.1 C), Max:99.3 F (37.4 C)  Recent Labs  Lab 11/10/17 1949 11/10/17 2003  WBC  --  4.5  CREATININE  --  1.01  LATICACIDVEN 1.6  --     CrCl cannot be calculated (Unknown ideal weight.).    No Known Allergies   Charles Poole, Charles Poole 11/10/2017 11:02 PM

## 2017-11-10 NOTE — ED Triage Notes (Signed)
To ED for eval of UTI symptoms. Pt has chronic cath. States symptoms yesterday- cloudy urine.

## 2017-11-11 ENCOUNTER — Encounter (HOSPITAL_COMMUNITY): Payer: Self-pay | Admitting: *Deleted

## 2017-11-11 DIAGNOSIS — S24102S Unspecified injury at T2-T6 level of thoracic spinal cord, sequela: Secondary | ICD-10-CM | POA: Diagnosis not present

## 2017-11-11 DIAGNOSIS — L03115 Cellulitis of right lower limb: Secondary | ICD-10-CM | POA: Diagnosis present

## 2017-11-11 DIAGNOSIS — B964 Proteus (mirabilis) (morganii) as the cause of diseases classified elsewhere: Secondary | ICD-10-CM | POA: Diagnosis present

## 2017-11-11 DIAGNOSIS — D509 Iron deficiency anemia, unspecified: Secondary | ICD-10-CM | POA: Diagnosis present

## 2017-11-11 DIAGNOSIS — Z87440 Personal history of urinary (tract) infections: Secondary | ICD-10-CM | POA: Diagnosis not present

## 2017-11-11 DIAGNOSIS — T24021S Burn of unspecified degree of right knee, sequela: Secondary | ICD-10-CM | POA: Diagnosis not present

## 2017-11-11 DIAGNOSIS — Z86718 Personal history of other venous thrombosis and embolism: Secondary | ICD-10-CM | POA: Diagnosis not present

## 2017-11-11 DIAGNOSIS — T83511A Infection and inflammatory reaction due to indwelling urethral catheter, initial encounter: Secondary | ICD-10-CM | POA: Diagnosis present

## 2017-11-11 DIAGNOSIS — K5900 Constipation, unspecified: Secondary | ICD-10-CM | POA: Diagnosis present

## 2017-11-11 DIAGNOSIS — N390 Urinary tract infection, site not specified: Secondary | ICD-10-CM

## 2017-11-11 DIAGNOSIS — Z933 Colostomy status: Secondary | ICD-10-CM | POA: Diagnosis not present

## 2017-11-11 DIAGNOSIS — Z7901 Long term (current) use of anticoagulants: Secondary | ICD-10-CM | POA: Diagnosis not present

## 2017-11-11 DIAGNOSIS — T83510D Infection and inflammatory reaction due to cystostomy catheter, subsequent encounter: Secondary | ICD-10-CM | POA: Diagnosis not present

## 2017-11-11 DIAGNOSIS — G8929 Other chronic pain: Secondary | ICD-10-CM | POA: Diagnosis present

## 2017-11-11 DIAGNOSIS — Z8249 Family history of ischemic heart disease and other diseases of the circulatory system: Secondary | ICD-10-CM | POA: Diagnosis not present

## 2017-11-11 DIAGNOSIS — W3400XS Accidental discharge from unspecified firearms or gun, sequela: Secondary | ICD-10-CM | POA: Diagnosis not present

## 2017-11-11 DIAGNOSIS — N319 Neuromuscular dysfunction of bladder, unspecified: Secondary | ICD-10-CM | POA: Diagnosis present

## 2017-11-11 DIAGNOSIS — Z9359 Other cystostomy status: Secondary | ICD-10-CM | POA: Diagnosis not present

## 2017-11-11 DIAGNOSIS — X16XXXS Contact with hot heating appliances, radiators and pipes, sequela: Secondary | ICD-10-CM | POA: Diagnosis present

## 2017-11-11 DIAGNOSIS — T310 Burns involving less than 10% of body surface: Secondary | ICD-10-CM | POA: Diagnosis present

## 2017-11-11 DIAGNOSIS — D696 Thrombocytopenia, unspecified: Secondary | ICD-10-CM | POA: Diagnosis present

## 2017-11-11 DIAGNOSIS — Z8619 Personal history of other infectious and parasitic diseases: Secondary | ICD-10-CM | POA: Diagnosis not present

## 2017-11-11 DIAGNOSIS — G8220 Paraplegia, unspecified: Secondary | ICD-10-CM

## 2017-11-11 DIAGNOSIS — I10 Essential (primary) hypertension: Secondary | ICD-10-CM | POA: Diagnosis present

## 2017-11-11 DIAGNOSIS — Y846 Urinary catheterization as the cause of abnormal reaction of the patient, or of later complication, without mention of misadventure at the time of the procedure: Secondary | ICD-10-CM | POA: Diagnosis present

## 2017-11-11 DIAGNOSIS — L909 Atrophic disorder of skin, unspecified: Secondary | ICD-10-CM | POA: Diagnosis not present

## 2017-11-11 DIAGNOSIS — L97519 Non-pressure chronic ulcer of other part of right foot with unspecified severity: Secondary | ICD-10-CM | POA: Diagnosis present

## 2017-11-11 LAB — BASIC METABOLIC PANEL
ANION GAP: 11 (ref 5–15)
BUN: 14 mg/dL (ref 6–20)
CO2: 26 mmol/L (ref 22–32)
Calcium: 8.7 mg/dL — ABNORMAL LOW (ref 8.9–10.3)
Chloride: 101 mmol/L (ref 101–111)
Creatinine, Ser: 0.98 mg/dL (ref 0.61–1.24)
GFR calc Af Amer: >60 mL/min (ref >60)
GFR calc non Af Amer: >60 mL/min (ref >60)
GLUCOSE: 165 mg/dL — AB (ref 65–99)
POTASSIUM: 3.5 mmol/L (ref 3.5–5.1)
SODIUM: 138 mmol/L (ref 135–145)

## 2017-11-11 LAB — CBC WITH DIFFERENTIAL/PLATELET
Basophils Absolute: 0 10*3/uL (ref 0.0–0.1)
Basophils Relative: 0 %
EOS PCT: 0 %
Eosinophils Absolute: 0 10*3/uL (ref 0.0–0.7)
HEMATOCRIT: 32.7 % — AB (ref 39.0–52.0)
Hemoglobin: 10.3 g/dL — ABNORMAL LOW (ref 13.0–17.0)
LYMPHS ABS: 0.8 10*3/uL (ref 0.7–4.0)
LYMPHS PCT: 20 %
MCH: 23.7 pg — AB (ref 26.0–34.0)
MCHC: 31.5 g/dL (ref 30.0–36.0)
MCV: 75.3 fL — AB (ref 78.0–100.0)
Monocytes Absolute: 0.3 10*3/uL (ref 0.1–1.0)
Monocytes Relative: 8 %
NEUTROS ABS: 3 10*3/uL (ref 1.7–7.7)
Neutrophils Relative %: 72 %
PLATELETS: 114 10*3/uL — AB (ref 150–400)
RBC: 4.34 MIL/uL (ref 4.22–5.81)
RDW: 14.5 % (ref 11.5–15.5)
WBC: 4.1 10*3/uL (ref 4.0–10.5)

## 2017-11-11 MED ORDER — PHENOL 1.4 % MT LIQD
1.0000 | OROMUCOSAL | Status: DC | PRN
  Administered 2017-11-11: 1 via OROMUCOSAL
  Filled 2017-11-11: qty 177

## 2017-11-11 MED ORDER — SILVER SULFADIAZINE 1 % EX CREA
TOPICAL_CREAM | Freq: Two times a day (BID) | CUTANEOUS | Status: DC
  Administered 2017-11-11 – 2017-11-13 (×3): via TOPICAL
  Filled 2017-11-11 (×2): qty 85

## 2017-11-11 MED ORDER — GERHARDT'S BUTT CREAM
TOPICAL_CREAM | Freq: Every day | CUTANEOUS | Status: DC
  Filled 2017-11-11: qty 1
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
## 2017-11-12 ENCOUNTER — Other Ambulatory Visit: Payer: Self-pay

## 2017-11-12 ENCOUNTER — Encounter (HOSPITAL_COMMUNITY): Payer: Self-pay | Admitting: General Practice

## 2017-11-12 DIAGNOSIS — I10 Essential (primary) hypertension: Secondary | ICD-10-CM

## 2017-11-12 DIAGNOSIS — L909 Atrophic disorder of skin, unspecified: Secondary | ICD-10-CM

## 2017-11-12 HISTORY — PX: SUPRAPUBIC CATHETER INSERTION: SUR719

## 2017-11-12 LAB — URINE CULTURE

## 2017-11-12 MED ORDER — DOCUSATE SODIUM 100 MG PO CAPS
200.0000 mg | ORAL_CAPSULE | Freq: Two times a day (BID) | ORAL | Status: DC
  Administered 2017-11-12 – 2017-11-13 (×3): 200 mg via ORAL
  Filled 2017-11-12 (×3): qty 2

## 2017-11-12 NOTE — Plan of Care (Signed)
  Activity: Risk for activity intolerance will decrease 11/12/2017 1409 - Progressing by Darrow BussingArcilla, Mohd Clemons M, RN   Nutrition: Adequate nutrition will be maintained 11/12/2017 1409 - Progressing by Darrow BussingArcilla, Marie Chow M, RN   Elimination: Will not experience complications related to bowel motility 11/12/2017 1409 - Progressing by Darrow BussingArcilla, Romy Mcgue M, RN   Pain Managment: General experience of comfort will improve 11/12/2017 1409 - Progressing by Darrow BussingArcilla, Matheo Rathbone M, RN   Safety: Ability to remain free from injury will improve 11/12/2017 1409 - Progressing by Darrow BussingArcilla, Teigen Parslow M, RN
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
## 2017-11-13 ENCOUNTER — Encounter (HOSPITAL_COMMUNITY): Payer: Self-pay | Admitting: Interventional Radiology

## 2017-11-13 ENCOUNTER — Inpatient Hospital Stay (HOSPITAL_COMMUNITY): Payer: Medicaid Other

## 2017-11-13 DIAGNOSIS — Z8619 Personal history of other infectious and parasitic diseases: Secondary | ICD-10-CM

## 2017-11-13 HISTORY — PX: IR CATHETER TUBE CHANGE: IMG717

## 2017-11-13 LAB — BASIC METABOLIC PANEL
Anion gap: 11 (ref 5–15)
BUN: 8 mg/dL (ref 6–20)
CALCIUM: 8.7 mg/dL — AB (ref 8.9–10.3)
CHLORIDE: 99 mmol/L — AB (ref 101–111)
CO2: 28 mmol/L (ref 22–32)
CREATININE: 0.82 mg/dL (ref 0.61–1.24)
GFR calc Af Amer: >60 mL/min (ref >60)
GFR calc non Af Amer: >60 mL/min (ref >60)
GLUCOSE: 129 mg/dL — AB (ref 65–99)
Potassium: 3.7 mmol/L (ref 3.5–5.1)
Sodium: 138 mmol/L (ref 135–145)

## 2017-11-13 LAB — CBC
HEMATOCRIT: 30.8 % — AB (ref 39.0–52.0)
Hemoglobin: 9.8 g/dL — ABNORMAL LOW (ref 13.0–17.0)
MCH: 23.6 pg — AB (ref 26.0–34.0)
MCHC: 31.8 g/dL (ref 30.0–36.0)
MCV: 74.2 fL — AB (ref 78.0–100.0)
Platelets: 199 10*3/uL (ref 150–400)
RBC: 4.15 MIL/uL — ABNORMAL LOW (ref 4.22–5.81)
RDW: 13.8 % (ref 11.5–15.5)
WBC: 6.9 10*3/uL (ref 4.0–10.5)

## 2017-11-13 MED ORDER — GERHARDT'S BUTT CREAM: 1.0000 "application " | TOPICAL_CREAM | Freq: Every day | CUTANEOUS | 0 refills | Status: AC

## 2017-11-13 MED ORDER — POLYETHYLENE GLYCOL 3350 17 G PO PACK
17.0000 g | PACK | Freq: Every day | ORAL | Status: DC | PRN
  Administered 2017-11-13: 17 g via ORAL
  Filled 2017-11-13: qty 1

## 2017-11-13 MED ORDER — CEPHALEXIN 500 MG PO CAPS: 500.0000 mg | ORAL_CAPSULE | Freq: Two times a day (BID) | ORAL | 0 refills | Status: AC

## 2017-11-13 MED ORDER — IOPAMIDOL (ISOVUE-300) INJECTION 61%
INTRAVENOUS | Status: AC
  Administered 2017-11-13: 15 mL
  Filled 2017-11-13: qty 50

## 2017-11-13 MED ORDER — NYSTATIN 100000 UNIT/GM EX POWD: Freq: Two times a day (BID) | CUTANEOUS | 0 refills | Status: AC

## 2017-11-13 MED ORDER — LIDOCAINE HCL 1 % IJ SOLN
INTRAMUSCULAR | Status: AC
  Filled 2017-11-13: qty 20

## 2017-11-13 MED ORDER — DOCUSATE SODIUM 100 MG PO CAPS: 200.0000 mg | ORAL_CAPSULE | Freq: Two times a day (BID) | ORAL | 0 refills | Status: AC

## 2017-11-13 MED ORDER — PANTOPRAZOLE SODIUM 40 MG PO TBEC
40.0000 mg | DELAYED_RELEASE_TABLET | Freq: Every day | ORAL | Status: DC
  Administered 2017-11-13: 40 mg via ORAL
  Filled 2017-11-13: qty 1
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
## 2017-11-13 NOTE — Plan of Care (Signed)
  Activity: Risk for activity intolerance will decrease 11/13/2017 1106 - Progressing by Darrow BussingArcilla, Anthonee Gelin M, RN   Nutrition: Adequate nutrition will be maintained 11/13/2017 1106 - Progressing by Darrow BussingArcilla, Zonya Gudger M, RN   Elimination: Will not experience complications related to bowel motility 11/13/2017 1106 - Progressing by Darrow BussingArcilla, Kaedon Fanelli M, RN   Pain Managment: General experience of comfort will improve 11/13/2017 1106 - Progressing by Darrow BussingArcilla, Ry Moody M, RN   Safety: Ability to remain free from injury will improve 11/13/2017 1106 - Progressing by Darrow BussingArcilla, Laron Angelini M, RN   Skin Integrity: Risk for impaired skin integrity will decrease 11/13/2017 1106 - Progressing by Darrow BussingArcilla, Maximilian Tallo M, RN

## 2017-11-13 NOTE — Progress Notes (Signed)
Removed IV, provided discharge education/instructions, all questions and concerns addressed, Pt not in distress, Pt to discharge home with belongings via [facility].

## 2017-11-13 NOTE — Progress Notes (Signed)
Pt not in distress, discharged home via [facility].

## 2017-11-13 NOTE — Plan of Care (Signed)
Progressing

## 2017-11-13 NOTE — Social Work (Signed)
CSW arranged ambulance transport via [facility] [phone]) to home address for 4:00pm.  Clinical Social Worker will sign off for now as social work intervention is no longer needed. Please consult us again if new need arises.  Keene BreathPatricia Haydon Kalmar, LCSW Clinical Social Worker [phone]

## 2017-11-13 NOTE — Care Management Note (Signed)
Case Management Note  Patient Details  Name: Charles GarfinkelKenneth Poole MRN: [id number] Date of Birth: 06/07/1971  Subjective/Objective:    47 yr old gentleman admitted with                Action/Plan:   Expected Discharge Date:                  Expected Discharge Plan:  Home/Self Care  In-House Referral:  NA  Discharge planning Services  CM Consult, Follow-up appt scheduled  Post Acute Care Choice:  NA Choice offered to:  Patient  DME Arranged:  N/A DME Agency:  NA  HH Arranged:  NA HH Agency:  NA  Status of Service:  Completed, signed off  If discussed at Long Length of Stay Meetings, dates discussed:    Additional Comments:  Durenda GuthrieBrady, Lyndel Sarate Naomi, RN 11/13/2017, 9:00 AM

## 2017-11-13 NOTE — Discharge Instructions (Signed)

## 2017-11-13 NOTE — Procedures (Signed)
Interventional Radiology Procedure Note  Procedure: Suprapubic catheter exchange.  Complications: None  Estimated Blood Loss: None  Findings: New 22 Fr Foley catheter advanced through SP tract and into bladder lumen.  Contrast injection under fluoro confirms positioning in bladder lumen.  Jory Welke T. Fredia SorrowYJodi Marbleamagata, M.D Pager:  [phone]

## 2017-11-15 LAB — CULTURE, BLOOD (ROUTINE X 2)
Culture: NO GROWTH
SPECIAL REQUESTS: ADEQUATE

## 2017-11-16 LAB — CULTURE, BLOOD (ROUTINE X 2)
Culture: NO GROWTH
SPECIAL REQUESTS: ADEQUATE

## 2017-11-25 ENCOUNTER — Other Ambulatory Visit: Payer: Self-pay | Admitting: Internal Medicine

## 2017-11-25 ENCOUNTER — Ambulatory Visit (HOSPITAL_COMMUNITY)
Admission: RE | Admit: 2017-11-25 | Discharge: 2017-11-25 | Disposition: A | Discharge status: Home / Self Care | Payer: Medicaid Other | Source: Ambulatory Visit | Attending: Internal Medicine | Admitting: Internal Medicine

## 2017-11-25 ENCOUNTER — Encounter (HOSPITAL_BASED_OUTPATIENT_CLINIC_OR_DEPARTMENT_OTHER): Payer: Medicaid Other | Attending: Internal Medicine

## 2017-11-25 DIAGNOSIS — T24231A Burn of second degree of right lower leg, initial encounter: Secondary | ICD-10-CM | POA: Insufficient documentation

## 2017-11-25 DIAGNOSIS — Z86718 Personal history of other venous thrombosis and embolism: Secondary | ICD-10-CM | POA: Diagnosis not present

## 2017-11-25 DIAGNOSIS — L97419 Non-pressure chronic ulcer of right heel and midfoot with unspecified severity: Secondary | ICD-10-CM | POA: Diagnosis present

## 2017-11-25 DIAGNOSIS — G8220 Paraplegia, unspecified: Secondary | ICD-10-CM | POA: Diagnosis not present

## 2017-11-25 DIAGNOSIS — L89613 Pressure ulcer of right heel, stage 3: Secondary | ICD-10-CM | POA: Diagnosis not present

## 2017-11-25 DIAGNOSIS — L97319 Non-pressure chronic ulcer of right ankle with unspecified severity: Secondary | ICD-10-CM | POA: Diagnosis not present

## 2017-11-25 DIAGNOSIS — M86171 Other acute osteomyelitis, right ankle and foot: Secondary | ICD-10-CM

## 2017-11-25 DIAGNOSIS — G8221 Paraplegia, complete: Secondary | ICD-10-CM | POA: Insufficient documentation

## 2017-11-25 DIAGNOSIS — X16XXXA Contact with hot heating appliances, radiators and pipes, initial encounter: Secondary | ICD-10-CM | POA: Diagnosis not present

## 2017-12-02 DIAGNOSIS — L89613 Pressure ulcer of right heel, stage 3: Secondary | ICD-10-CM | POA: Diagnosis not present

## 2017-12-09 ENCOUNTER — Encounter (HOSPITAL_BASED_OUTPATIENT_CLINIC_OR_DEPARTMENT_OTHER): Payer: Medicaid Other | Attending: Internal Medicine

## 2017-12-09 DIAGNOSIS — L89612 Pressure ulcer of right heel, stage 2: Secondary | ICD-10-CM | POA: Diagnosis not present

## 2017-12-09 DIAGNOSIS — G8221 Paraplegia, complete: Secondary | ICD-10-CM | POA: Insufficient documentation

## 2017-12-09 DIAGNOSIS — T24201A Burn of second degree of unspecified site of right lower limb, except ankle and foot, initial encounter: Secondary | ICD-10-CM | POA: Insufficient documentation

## 2017-12-09 DIAGNOSIS — T24221A Burn of second degree of right knee, initial encounter: Secondary | ICD-10-CM | POA: Diagnosis present

## 2017-12-09 DIAGNOSIS — X16XXXA Contact with hot heating appliances, radiators and pipes, initial encounter: Secondary | ICD-10-CM | POA: Insufficient documentation

## 2017-12-16 DIAGNOSIS — T24221A Burn of second degree of right knee, initial encounter: Secondary | ICD-10-CM | POA: Diagnosis not present

## 2017-12-23 DIAGNOSIS — T24221A Burn of second degree of right knee, initial encounter: Secondary | ICD-10-CM | POA: Diagnosis not present

## 2017-12-31 DIAGNOSIS — T24221A Burn of second degree of right knee, initial encounter: Secondary | ICD-10-CM | POA: Diagnosis not present

## 2018-01-07 ENCOUNTER — Encounter (HOSPITAL_BASED_OUTPATIENT_CLINIC_OR_DEPARTMENT_OTHER): Payer: Medicaid Other | Attending: Internal Medicine

## 2018-01-07 DIAGNOSIS — G8221 Paraplegia, complete: Secondary | ICD-10-CM | POA: Diagnosis not present

## 2018-01-07 DIAGNOSIS — X16XXXA Contact with hot heating appliances, radiators and pipes, initial encounter: Secondary | ICD-10-CM | POA: Insufficient documentation

## 2018-01-07 DIAGNOSIS — Z86718 Personal history of other venous thrombosis and embolism: Secondary | ICD-10-CM | POA: Insufficient documentation

## 2018-01-07 DIAGNOSIS — L89613 Pressure ulcer of right heel, stage 3: Secondary | ICD-10-CM | POA: Diagnosis not present

## 2018-01-07 DIAGNOSIS — T24331A Burn of third degree of right lower leg, initial encounter: Secondary | ICD-10-CM | POA: Diagnosis present

## 2018-01-14 DIAGNOSIS — T24331A Burn of third degree of right lower leg, initial encounter: Secondary | ICD-10-CM | POA: Diagnosis not present

## 2018-01-21 DIAGNOSIS — T24331A Burn of third degree of right lower leg, initial encounter: Secondary | ICD-10-CM | POA: Diagnosis not present
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
## 2018-01-29 DIAGNOSIS — T24331A Burn of third degree of right lower leg, initial encounter: Secondary | ICD-10-CM | POA: Diagnosis not present

## 2018-02-05 ENCOUNTER — Encounter (HOSPITAL_BASED_OUTPATIENT_CLINIC_OR_DEPARTMENT_OTHER): Payer: Medicaid Other | Attending: Internal Medicine

## 2018-02-05 DIAGNOSIS — Z86718 Personal history of other venous thrombosis and embolism: Secondary | ICD-10-CM | POA: Insufficient documentation

## 2018-02-05 DIAGNOSIS — L89613 Pressure ulcer of right heel, stage 3: Secondary | ICD-10-CM | POA: Insufficient documentation

## 2018-02-05 DIAGNOSIS — G8221 Paraplegia, complete: Secondary | ICD-10-CM | POA: Insufficient documentation

## 2018-02-18 DIAGNOSIS — Z86718 Personal history of other venous thrombosis and embolism: Secondary | ICD-10-CM | POA: Diagnosis not present

## 2018-02-18 DIAGNOSIS — L89613 Pressure ulcer of right heel, stage 3: Secondary | ICD-10-CM | POA: Diagnosis present

## 2018-02-18 DIAGNOSIS — G8221 Paraplegia, complete: Secondary | ICD-10-CM | POA: Diagnosis not present

## 2018-02-27 DIAGNOSIS — L89613 Pressure ulcer of right heel, stage 3: Secondary | ICD-10-CM | POA: Diagnosis not present
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
